# Patient Record
Sex: Female | Born: 2000 | Race: White | Hispanic: No | Marital: Single | Smoking: Never smoker
Health system: Southern US, Community
[De-identification: ages and names within clinical notes are randomized; demographics above are authoritative.]

## PROBLEM LIST (undated history)

## (undated) DIAGNOSIS — K219 Gastro-esophageal reflux disease without esophagitis: Secondary | ICD-10-CM

## (undated) HISTORY — PX: TONSILLECTOMY: SUR1361

---
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
## 2018-12-28 ENCOUNTER — Telehealth: Payer: Medicaid Other | Admitting: Physician Assistant

## 2018-12-28 DIAGNOSIS — R112 Nausea with vomiting, unspecified: Secondary | ICD-10-CM

## 2018-12-28 DIAGNOSIS — R197 Diarrhea, unspecified: Secondary | ICD-10-CM | POA: Diagnosis not present

## 2018-12-28 DIAGNOSIS — E876 Hypokalemia: Secondary | ICD-10-CM | POA: Diagnosis not present

## 2018-12-28 DIAGNOSIS — Z20828 Contact with and (suspected) exposure to other viral communicable diseases: Secondary | ICD-10-CM | POA: Diagnosis not present

## 2018-12-28 MED ORDER — ONDANSETRON HCL 4 MG PO TABS: 4.0000 mg | ORAL_TABLET | Freq: Three times a day (TID) | ORAL | 0 refills | Status: DC | PRN

## 2018-12-28 NOTE — Progress Notes (Signed)
We are sorry that you are not feeling well. Here is how we plan to help!  Based on what you have shared with me it looks like you potentially have a Virus that is irritating your GI tract and causing nausea/vomiting. Significant stress can also cause/contribute to this as the GI tract has its own nervous system and is sensitive to changes in adrenaline caused by stress.  Vomiting is the forceful emptying of a portion of the stomach's content through the mouth.  Although nausea and vomiting can make you feel miserable, it's important to remember that these are not diseases, but rather symptoms of an underlying illness.  When we treat short term symptoms, we always caution that any symptoms that persist should be fully evaluated in a medical office.  I have prescribed a medication that will help alleviate your symptoms and allow you to stay hydrated:  Zofran 4 mg 1 tablet every 8 hours as needed for nausea and vomiting  If this does not calm things down or you note any worsening symptoms, inability to keep hydrated, you need to go to the ER. I do recommend that you be seen by your primary care provider this week for further assessment as well.   HOME CARE:  Drink clear liquids.  This is very important! Dehydration (the lack of fluid) can lead to a serious complication.  Start off with 1 tablespoon every 5 minutes for 8 hours.  You may begin eating bland foods after 8 hours without vomiting.  Start with saltine crackers, white bread, rice, mashed potatoes, applesauce.  After 48 hours on a bland diet, you may resume a normal diet.  Try to go to sleep.  Sleep often empties the stomach and relieves the need to vomit.  GET HELP RIGHT AWAY IF:   Your symptoms do not improve or worsen within 2 days after treatment.  You have a fever for over 3 days.  You cannot keep down fluids after trying the medication.  MAKE SURE YOU:   Understand these instructions.  Will watch your condition.  Will  get help right away if you are not doing well or get worse.   Thank you for choosing an e-visit. Your e-visit answers were reviewed by a board certified advanced clinical practitioner to complete your personal care plan. Depending upon the condition, your plan could have included both over the counter or prescription medications. Please review your pharmacy choice. Be sure that the pharmacy you have chosen is open so that you can pick up your prescription now.  If there is a problem you may message your provider in [place] to have the prescription routed to another pharmacy. Your safety is important to [place]. If you have drug allergies check your prescription carefully.  For the next 24 hours, you can use MyChart to ask questions about today's visit, request a non-urgent call back, or ask for a work or school excuse from your e-visit provider. You will get an e-mail in the next two days asking about your experience. I hope that your e-visit has been valuable and will speed your recovery.

## 2018-12-28 NOTE — Progress Notes (Signed)
I have spent 5 minutes in review of e-visit questionnaire, review and updating patient chart, medical decision making and response to patient.   Jeronimo Hellberg Cody Stockton Nunley, PA-C    

## 2018-12-29 ENCOUNTER — Encounter: Payer: Self-pay | Admitting: Emergency Medicine

## 2018-12-29 ENCOUNTER — Other Ambulatory Visit: Payer: Self-pay

## 2018-12-29 ENCOUNTER — Emergency Department: Payer: Medicaid Other

## 2018-12-29 ENCOUNTER — Emergency Department
Admission: EM | Admit: 2018-12-29 | Discharge: 2018-12-29 | Disposition: A | Discharge status: Home / Self Care | Payer: Medicaid Other | Attending: Emergency Medicine | Admitting: Emergency Medicine

## 2018-12-29 DIAGNOSIS — E876 Hypokalemia: Secondary | ICD-10-CM

## 2018-12-29 DIAGNOSIS — Z20822 Contact with and (suspected) exposure to covid-19: Secondary | ICD-10-CM

## 2018-12-29 DIAGNOSIS — R112 Nausea with vomiting, unspecified: Secondary | ICD-10-CM

## 2018-12-29 HISTORY — DX: Gastro-esophageal reflux disease without esophagitis: K21.9

## 2018-12-29 LAB — COMPREHENSIVE METABOLIC PANEL
ALT: 16 U/L (ref 0–44)
AST: 17 U/L (ref 15–41)
Albumin: 5.3 g/dL — ABNORMAL HIGH (ref 3.5–5.0)
Alkaline Phosphatase: 67 U/L (ref 38–126)
Anion gap: 12 (ref 5–15)
BUN: 21 mg/dL — ABNORMAL HIGH (ref 6–20)
CO2: 24 mmol/L (ref 22–32)
Calcium: 9.8 mg/dL (ref 8.9–10.3)
Chloride: 101 mmol/L (ref 98–111)
Creatinine, Ser: 0.78 mg/dL (ref 0.44–1.00)
GFR calc Af Amer: >60 mL/min (ref >60)
GFR calc non Af Amer: >60 mL/min (ref >60)
Glucose, Bld: 161 mg/dL — ABNORMAL HIGH (ref 70–99)
Potassium: 2.9 mmol/L — ABNORMAL LOW (ref 3.5–5.1)
Sodium: 137 mmol/L (ref 135–145)
Total Bilirubin: 1.3 mg/dL — ABNORMAL HIGH (ref 0.3–1.2)
Total Protein: 8.3 g/dL — ABNORMAL HIGH (ref 6.5–8.1)

## 2018-12-29 LAB — CBC
HCT: 47.6 % — ABNORMAL HIGH (ref 36.0–46.0)
Hemoglobin: 16.9 g/dL — ABNORMAL HIGH (ref 12.0–15.0)
MCH: 31.6 pg (ref 26.0–34.0)
MCHC: 35.5 g/dL (ref 30.0–36.0)
MCV: 89 fL (ref 80.0–100.0)
Platelets: 297 10*3/uL (ref 150–400)
RBC: 5.35 MIL/uL — ABNORMAL HIGH (ref 3.87–5.11)
RDW: 12 % (ref 11.5–15.5)
WBC: 22.7 10*3/uL — ABNORMAL HIGH (ref 4.0–10.5)
nRBC: 0 % (ref 0.0–0.2)

## 2018-12-29 LAB — URINALYSIS, COMPLETE (UACMP) WITH MICROSCOPIC
Bilirubin Urine: NEGATIVE
Glucose, UA: NEGATIVE mg/dL
Ketones, ur: 80 mg/dL — AB
Leukocytes,Ua: NEGATIVE
Nitrite: NEGATIVE
Protein, ur: 30 mg/dL — AB
Specific Gravity, Urine: 1.033 — ABNORMAL HIGH (ref 1.005–1.030)
pH: 5 (ref 5.0–8.0)

## 2018-12-29 LAB — LIPASE, BLOOD: Lipase: 24 U/L (ref 11–51)

## 2018-12-29 LAB — POCT PREGNANCY, URINE: Preg Test, Ur: NEGATIVE

## 2018-12-29 MED ORDER — ONDANSETRON HCL 4 MG/2ML IJ SOLN
4.0000 mg | Freq: Once | INTRAMUSCULAR | Status: AC
  Administered 2018-12-29: 4 mg via INTRAVENOUS
  Filled 2018-12-29: qty 2

## 2018-12-29 MED ORDER — KETOROLAC TROMETHAMINE 30 MG/ML IJ SOLN
15.0000 mg | Freq: Once | INTRAMUSCULAR | Status: AC
  Administered 2018-12-29: 15 mg via INTRAVENOUS
  Filled 2018-12-29: qty 1

## 2018-12-29 MED ORDER — FAMOTIDINE IN NACL 20-0.9 MG/50ML-% IV SOLN
20.0000 mg | Freq: Once | INTRAVENOUS | Status: AC
  Administered 2018-12-29: 20 mg via INTRAVENOUS
  Filled 2018-12-29: qty 50

## 2018-12-29 MED ORDER — POTASSIUM CHLORIDE CRYS ER 20 MEQ PO TBCR
40.0000 meq | EXTENDED_RELEASE_TABLET | Freq: Once | ORAL | Status: AC
  Administered 2018-12-29: 40 meq via ORAL
  Filled 2018-12-29: qty 2

## 2018-12-29 MED ORDER — ONDANSETRON 4 MG PO TBDP: 4.0000 mg | ORAL_TABLET | Freq: Three times a day (TID) | ORAL | 0 refills | Status: DC | PRN

## 2018-12-29 MED ORDER — SODIUM CHLORIDE 0.9 % IV BOLUS
1000.0000 mL | Freq: Once | INTRAVENOUS | Status: AC
Start: 2018-12-29 — End: 2018-12-29
  Administered 2018-12-29: 1000 mL via INTRAVENOUS

## 2018-12-29 NOTE — ED Notes (Signed)
Patient assisted to bathroom 

## 2018-12-29 NOTE — Discharge Instructions (Signed)

## 2018-12-29 NOTE — ED Provider Notes (Signed)
[facility] Emergency Department Provider Note  ____________________________________________  Time seen: Approximately 1:59 AM  I have reviewed the triage vital signs and the nursing notes.   HISTORY  Chief Complaint Abdominal Pain, Diarrhea, Cough, Generalized Body Aches, and Headache   HPI Robin Maxwell is a 18 y.o. female no significant past medical history who presents for evaluation of COVID-like symptoms.  Patient has had symptoms for 3 days.  She is complaining of nausea, several daily episodes of nonbloody nonbilious emesis, has been unable to keep anything down.   Also reports several daily episodes of diarrhea.  Has had subjective fever and body aches.  She is complaining of cough.  No chest pain or shortness of breath, no dysuria or hematuria.  She is complaining of severe diffuse cramping abdominal pain.  No known exposures to COVID.  Patient does not work.  Past Medical History:  Diagnosis Date  . GERD (gastroesophageal reflux disease)     Past Surgical History:  Procedure Laterality Date  . TONSILLECTOMY      Prior to Admission medications   Medication Sig Start Date End Date Taking? Authorizing Provider  famotidine (PEPCID) 20 MG tablet Take 1 tablet (20 mg total) by mouth 2 (two) times daily. 06/09/16  Yes Emily FilbertWilliams, Jonathan E, MD  ondansetron (ZOFRAN ODT) 4 MG disintegrating tablet Take 1 tablet (4 mg total) by mouth every 8 (eight) hours as needed. 12/29/18   Nita SickleVeronese, [place], MD    Allergies Patient has no known allergies.  History reviewed. No pertinent family history.  Social History Social History   Tobacco Use  . Smoking status: Never Smoker  . Smokeless tobacco: Never Used  Substance Use Topics  . Alcohol use: Yes  . Drug use: Never    Review of Systems  Constitutional: + fever, body aches Eyes: Negative for visual changes. ENT: Negative for sore throat. Neck: No neck pain  Cardiovascular: Negative for chest  pain. Respiratory: Negative for shortness of breath. + cough Gastrointestinal: + abdominal pain, vomiting and diarrhea. Genitourinary: Negative for dysuria. Musculoskeletal: Negative for back pain. Skin: Negative for rash. Neurological: Negative for headaches, weakness or numbness. Psych: No SI or HI  ____________________________________________   PHYSICAL EXAM:  VITAL SIGNS: ED Triage Vitals  Enc Vitals Group     BP 12/29/18 0013 134/83     Pulse Rate 12/29/18 0013 71     Resp 12/29/18 0013 16     Temp 12/29/18 0013 99.7 F (37.6 C)     Temp Source 12/29/18 0013 Oral     SpO2 12/29/18 0013 97 %     Weight 12/29/18 0010 159 lb 13.3 oz (72.5 kg)     Height 12/29/18 0010 5\' 3"  (1.6 m)     Head Circumference --      Peak Flow --      Pain Score 12/29/18 0009 9     Pain Loc --      Pain Edu? --      Excl. in GC? --     Constitutional: Alert and oriented. Well appearing and in no apparent distress. HEENT:      Head: Normocephalic and atraumatic.         Eyes: Conjunctivae are normal. Sclera is non-icteric.       Mouth/Throat: Mucous membranes are moist.       Neck: Supple with no signs of meningismus. Cardiovascular: Regular rate and rhythm. No murmurs, gallops, or rubs. 2+ symmetrical distal pulses are present in all  extremities. No JVD. Respiratory: Normal respiratory effort. Lungs are clear to auscultation bilaterally. No wheezes, crackles, or rhonchi.  Gastrointestinal: Soft, mild diffuse tenderness to palpation worse on the left quadrants, and non distended with positive bowel sounds. No rebound or guarding. Genitourinary: No CVA tenderness. Musculoskeletal: Nontender with normal range of motion in all extremities. No edema, cyanosis, or erythema of extremities. Neurologic: Normal speech and language. Face is symmetric. Moving all extremities. No gross focal neurologic deficits are appreciated. Skin: Skin is warm, dry and intact. No rash noted. Psychiatric: Mood and  affect are normal. Speech and behavior are normal.  ____________________________________________   LABS (all labs ordered are listed, but only abnormal results are displayed)  Labs Reviewed  COMPREHENSIVE METABOLIC PANEL - Abnormal; Notable for the following components:      Result Value   Potassium 2.9 (*)    Glucose, Bld 161 (*)    BUN 21 (*)    Total Protein 8.3 (*)    Albumin 5.3 (*)    Total Bilirubin 1.3 (*)    All other components within normal limits  CBC - Abnormal; Notable for the following components:   WBC 22.7 (*)    RBC 5.35 (*)    Hemoglobin 16.9 (*)    HCT 47.6 (*)    All other components within normal limits  URINALYSIS, COMPLETE (UACMP) WITH MICROSCOPIC - Abnormal; Notable for the following components:   Color, Urine YELLOW (*)    APPearance HAZY (*)    Specific Gravity, Urine 1.033 (*)    Hgb urine dipstick SMALL (*)    Ketones, ur 80 (*)    Protein, ur 30 (*)    Bacteria, UA RARE (*)    All other components within normal limits  NOVEL CORONAVIRUS, NAA (HOSPITAL ORDER, SEND-OUT TO REF LAB)  LIPASE, BLOOD  POCT PREGNANCY, URINE  POC URINE PREG, ED   ____________________________________________  EKG  none  ____________________________________________  RADIOLOGY  I have personally reviewed the images performed during this visit and I agree with the Radiologist's read.   Interpretation by Radiologist:  Dg Chest Portable 1 View  Result Date: 12/29/2018 CLINICAL DATA:  18 year old female with vomiting and abdominal pain. EXAM: PORTABLE CHEST 1 VIEW COMPARISON:  None. FINDINGS: The heart size and mediastinal contours are within normal limits. Both lungs are clear. The visualized skeletal structures are unremarkable. IMPRESSION: No active disease. Electronically Signed   By: Elgie CollardArash  Radparvar M.D.   On: 12/29/2018 02:14      ____________________________________________   PROCEDURES  Procedure(s) performed: None Procedures Critical Care  performed:  None ____________________________________________   INITIAL IMPRESSION / ASSESSMENT AND PLAN / ED COURSE  18 y.o. female no significant past medical history who presents for evaluation of COVID-like symptoms x 3 days including fever, body aches, vomiting, diarrhea, cough, abdominal.  Patient is well-appearing and in no distress with normal vital signs, normal work of breathing, normal sats, abdomen is soft with diffuse mild tenderness with no localized tenderness, rebound or guarding.  Differential diagnoses including COVID-19 versus viral gastroenteritis versus pneumonia.  Will check labs, chest x-ray, duo COVID swab.  At this time low suspicion for appendicitis with GI and respiratory symptoms, diffuse tenderness which is worse on the left quadrants.  Will give Toradol, fluids, and Zofran    _________________________ 4:14 AM on 12/29/2018 -----------------------------------------  Labs showing elevated white count of 22.7 consistent with infectious etiology.  LFTs and lipase are within normal limits.  UA negative for UTI.  Chest x-ray negative for  pneumonia.  COVID swabs pending.  Patient feels markedly improved after medications given.  Serial abdominal exams showing no further tenderness on palpation.  Discussed importance to return to the emergency room if abdominal pain returns and migrates to the right lower quadrant.  Also discussed quarantine for COVID-19 for patient and household members.  Discussed getting a pulse oximeter to check for hypoxia.  Discussed my standard return precautions.  Discussed follow-up with PCP.  Patient is tolerating p.o. with no further episodes of vomiting, diarrhea, or abdominal pain.    As part of my medical decision making, I reviewed the following data within the [place] notes reviewed and incorporated, Labs reviewed , Old chart reviewed, Radiograph reviewed , Notes from prior ED visits and [place] Controlled Substance  Database   Patient was evaluated in Emergency Department today for the symptoms described in the history of present illness. Patient was evaluated in the context of the global COVID-19 pandemic, which necessitated consideration that the patient might be at risk for infection with the SARS-CoV-2 virus that causes COVID-19. Institutional protocols and algorithms that pertain to the evaluation of patients at risk for COVID-19 are in a state of rapid change based on information released by regulatory bodies including the CDC and federal and state organizations. These policies and algorithms were followed during the patient's care in the ED.   ____________________________________________   FINAL CLINICAL IMPRESSION(S) / ED DIAGNOSES   Final diagnoses:  Suspected Covid-19 Virus Infection  Hypokalemia  Nausea vomiting and diarrhea      NEW MEDICATIONS STARTED DURING THIS VISIT:  ED Discharge Orders         Ordered    ondansetron (ZOFRAN ODT) 4 MG disintegrating tablet  Every 8 hours PRN     12/29/18 0413           Note:  This document was prepared using Dragon voice recognition software and may include unintentional dictation errors.    Alfred Levins, [place], MD 12/29/18 [phone]

## 2018-12-29 NOTE — ED Notes (Signed)
Patient given ginger ale at this time. 

## 2018-12-29 NOTE — ED Notes (Signed)
Family at bedside. 

## 2018-12-29 NOTE — ED Triage Notes (Addendum)
Pt reports low midline abd pain with N/V/D for 3 days; pt says when she vomits she has burning in her esophagus; denies urinary s/s; pt in no acute distress; pt adds she's had intermittent headache for the last 3 days; also having cough and body aches;

## 2019-01-01 LAB — NOVEL CORONAVIRUS, NAA (HOSP ORDER, SEND-OUT TO REF LAB; TAT 18-24 HRS): SARS-CoV-2, NAA: NOT DETECTED

## 2019-11-04 ENCOUNTER — Telehealth: Payer: Self-pay | Admitting: Family Medicine

## 2019-11-04 NOTE — Telephone Encounter (Signed)
Phone call to pt. Last sex 2 months ago. Nexplanon placed about 3 years ago. Pt to continue to abstain from sex, at minimum use back-up method will all sex. Per pt preference for Monday appts, scheduled physical and nexplanon removal/reinsertion for 11/24/19.

## 2019-11-04 NOTE — Telephone Encounter (Signed)
patient wants nexplanon removed and re inserted

## 2019-11-24 ENCOUNTER — Other Ambulatory Visit: Payer: Self-pay

## 2019-11-24 ENCOUNTER — Ambulatory Visit (LOCAL_COMMUNITY_HEALTH_CENTER): Payer: Medicaid Other | Admitting: Family Medicine

## 2019-11-24 ENCOUNTER — Encounter: Payer: Self-pay | Admitting: Family Medicine

## 2019-11-24 ENCOUNTER — Ambulatory Visit: Payer: Medicaid Other

## 2019-11-24 VITALS — BP 103/63 | Ht 63.0 in | Wt 147.4 lb

## 2019-11-24 DIAGNOSIS — Z01419 Encounter for gynecological examination (general) (routine) without abnormal findings: Secondary | ICD-10-CM | POA: Diagnosis not present

## 2019-11-24 DIAGNOSIS — Z975 Presence of (intrauterine) contraceptive device: Secondary | ICD-10-CM

## 2019-11-24 DIAGNOSIS — Z30017 Encounter for initial prescription of implantable subdermal contraceptive: Secondary | ICD-10-CM | POA: Diagnosis not present

## 2019-11-24 DIAGNOSIS — Z3009 Encounter for other general counseling and advice on contraception: Secondary | ICD-10-CM | POA: Diagnosis not present

## 2019-11-24 DIAGNOSIS — Z113 Encounter for screening for infections with a predominantly sexual mode of transmission: Secondary | ICD-10-CM

## 2019-11-24 DIAGNOSIS — Z3046 Encounter for surveillance of implantable subdermal contraceptive: Secondary | ICD-10-CM

## 2019-11-24 LAB — WET PREP FOR TRICH, YEAST, CLUE
Trichomonas Exam: NEGATIVE
Yeast Exam: NEGATIVE

## 2019-11-24 MED ORDER — ETONOGESTREL 68 MG ~~LOC~~ IMPL
68.0000 mg | DRUG_IMPLANT | Freq: Once | SUBCUTANEOUS | Status: AC
  Administered 2019-11-24: 68 mg via SUBCUTANEOUS

## 2019-11-24 NOTE — Progress Notes (Signed)
Pt did not return from lab and did not return to clinic for posting.  Condoms declined during initial encounter with pt.  Wet mount reviewed, no tx per standing order; pt left and did not return to clinic to discuss lab results.

## 2019-11-24 NOTE — Progress Notes (Signed)
Family Planning Visit- Initial Visit  Subjective:  Robin Maxwell is a 19 y.o.  G0P0000  being seen today for an initial well woman visit and to discuss family planning options. Patient reports they do not want a pregnancy in the next year.   Chief Complaint  Patient presents with  . Contraception  . Annual Exam    Pt has Clavicle fracture, shaft and Nexplanon in place on their problem list.   HPI  Patient reports she is here for PE, STI check and Nexplanon insertion/removal. Last Nexplanon placed 11/23/2016  Pt denies all of the following contraindications to Nexplanon: -Known or suspected pregnancy -Current or past history of thrombosis or thromboembolic disorders -Hepatic tumor or active liver disease -Undiagnosed abnormal genital bleeding -Known or suspected breast cancer, history of breast cancer, or other progestin-sensitive cancer -Allergic reaction to any component of the method    Patient's last menstrual period was 11/16/2019 (exact date). Last sex: 2 months BCM: nexplanon Pt desires EC? n/a  Last pap per pt/review of record: never d/t age Last HIV test per pt/review of record: "a long time" Last tetanus vaccine: 01/2010  Last breast exam: never Personal/family hx breast cancer? no  Patient reports 2 partner(s) in last year. Do they desire STI screening (if no, why not)? yes  Does the patient desire a pregnancy in the next year? no   19 y.o., Body mass index is 26.11 kg/m. - Is patient eligible for HA1C diabetes screening based on BMI and age >43?  no  Has patient been screened once for HCV in the past?  no  No results found for: HCVAB  Does the patient have current of drug use, have a partner with drug use, and/or has been incarcerated since last result? no If yes-- Screen for HCV through [facility]   Does the patient meet criteria for HBV testing? no Criteria:  -Household, sexual or needle sharing contact with HBV -History of drug use -HIV  positive -Those with known Hep C  PHQ-2 score: 0  See flowsheet for other program required questions.   Health Maintenance Due  Topic Date Due  . Hepatitis C Screening  Never done  . HIV Screening  Never done  . TETANUS/TDAP  Never done  . PAP-Cervical Cytology Screening  Never done  . PAP SMEAR-Modifier  Never done    ROS 10 point review of systems is otherwise negative except as mentioned in HPI and listed below: none  The following portions of the patient's history were reviewed and updated as appropriate: allergies, current medications, past family history, past medical history, past social history, past surgical history and problem list. Problem list updated.   See flowsheet for other program required questions.  Objective:   Vitals:   11/24/19 1041  BP: 103/63  Weight: 147 lb 6.4 oz (66.9 kg)  Height: 5\' 3"  (1.6 m)    Physical Exam Vitals and nursing note reviewed.  Constitutional:      Appearance: Normal appearance.  HENT:     Head: Normocephalic and atraumatic.     Mouth/Throat:     Mouth: Mucous membranes are moist.     Pharynx: Oropharynx is clear. No oropharyngeal exudate or posterior oropharyngeal erythema.  Eyes:     Conjunctiva/sclera: Conjunctivae normal.  Neck:     Thyroid: No thyroid mass, thyromegaly or thyroid tenderness.  Cardiovascular:     Rate and Rhythm: Normal rate and regular rhythm.     Pulses: Normal pulses.  Heart sounds: Normal heart sounds.  Pulmonary:     Effort: Pulmonary effort is normal.     Breath sounds: Normal breath sounds.  Chest:     Breasts:        Right: Normal. No swelling, mass, nipple discharge, skin change or tenderness.        Left: Normal. No swelling, mass, nipple discharge, skin change or tenderness.  Abdominal:     General: Abdomen is flat.     Palpations: There is no mass.     Tenderness: There is no abdominal tenderness. There is no rebound.  Genitourinary:     General: Normal vulva.     Exam  position: Lithotomy position.     Pubic Area: No rash or pubic lice.      Labia:        Right: No rash or lesion.        Left: No rash or lesion.      Vagina: Normal. No vaginal discharge, erythema, bleeding or lesions.     Cervix: No cervical motion tenderness, discharge, friability, lesion or erythema.     Uterus: Normal.      Adnexa: Right adnexa normal and left adnexa normal.     Rectum: Normal.  Lymphadenopathy:     Head:     Right side of head: No preauricular or posterior auricular adenopathy.     Left side of head: No preauricular or posterior auricular adenopathy.     Cervical: No cervical adenopathy.     Upper Body:     Right upper body: No supraclavicular or axillary adenopathy.     Left upper body: No supraclavicular or axillary adenopathy.     Lower Body: No right inguinal adenopathy. No left inguinal adenopathy.  Skin:    General: Skin is warm and dry.     Findings: No rash.  Neurological:     Mental Status: She is alert and oriented to person, place, and time.   Nexplanon Removal and Insertion  Patient identified, informed consent performed, consent signed.   Patient does understand that irregular bleeding is a very common side effect of this medication. She was advised to have backup contraception for one week after replacement of the implant. Patient deemed to meet WHO criteria for being reasonably certain she is not pregnant.  Appropriate time out taken. Nexplanon site identified. Area prepped in usual sterile fashon. 3 ml of 1% lidocaine with epinephrine was used to anesthetize the area at the distal end of the implant. A small stab incision was made right beside the implant on the distal portion. The Nexplanon rod was grasped using hemostats and removed without difficulty. There was minimal blood loss. There were no complications.   Confirmed correct location of insertion site. The insertion site was identified 8-10 cm (3-4 inches) from the medial epicondyle of the  humerus and 3-5 cm (1.25-2 inches) posterior to (below) the sulcus (groove) between the biceps and triceps muscles of the patient's left arm. New Nexplanon removed from packaging, Device confirmed in needle, then inserted full length of needle and withdrawn per handbook instructions. Nexplanon was able to palpated in the patient's left arm; patient palpated the insert herself.  There was minimal blood loss. Patient insertion site covered with guaze and a pressure bandage to reduce any bruising. The patient tolerated the procedure well and was given post procedure instructions.      Assessment and Plan:  Robin Maxwell is a 19 y.o. female presenting to the [facility] Department for an initial well woman exam/family planning visit.  Contraception counseling: Reviewed all forms of birth control options in the tiered based approach. available including abstinence; over the counter/barrier methods; hormonal contraceptive medication including pill, patch, ring, injection,contraceptive implant, ECP; hormonal and nonhormonal IUDs; permanent sterilization options including vasectomy and the various tubal sterilization modalities. Risks, benefits, and typical effectiveness rates were reviewed.  Questions were answered.  Written information was also given to the patient to review.  Patient desires Nexplanon again, this was prescribed for patient. She will follow up in  1 year for surveillance.  She was told to call with any further questions, or with any concerns about this method of contraception.  Emphasized use of condoms 100% of the time for STI prevention.  Emergency Contraception: n/a - continuous use of nexplanon   1. Well woman exam -BCM: nexplanon replaced today. BCM counseling as above. -Pap: done today -CBE: done today. "Active FYIs" info up to date. Recommended screening mammograms beginning at age 21 - etonogestrel (NEXPLANON) implant 68 mg - Pap IG (Image Guided)  2. Screening  examination for venereal disease -Pt without symptoms. Screenings today as below. Treat wet prep per standing order. -Patient does not meet criteria for HepB, HepC Screening.  -Counseled on warning s/sx and when to seek care. Recommended condom use with all sex and discussed importance of condom use for STI prevention. - WET PREP FOR TRICH, YEAST, CLUE - Chlamydia/Gonorrhea [place] [facility] - HIV [place] [facility] - Syphilis Serology, [place] [facility]  3. Encounter for removal and reinsertion of Nexplanon -Nexplanon replaced today. -Counseling regarding risks and benefits of Nexplanon given. Advised to keep track of bleeding pattern and if bothersome to RTC for  management strategies. -Use backup protection x7 days - etonogestrel (NEXPLANON) implant 68 mg       Return in about 1 year (around 11/23/2020) for yearly wellness exam.  No future appointments.  Ann Held, PA-C

## 2019-11-24 NOTE — Progress Notes (Signed)
Pt here physical, STD checks, and nexplanon removal & reinsertion.

## 2019-11-26 LAB — PAP IG (IMAGE GUIDED): PAP Smear Comment: 0

## 2019-12-30 ENCOUNTER — Telehealth: Payer: Self-pay | Admitting: Family Medicine

## 2019-12-30 NOTE — Telephone Encounter (Signed)
WANTS STD TEST RESULTS AND PAP RESULTS

## 2019-12-30 NOTE — Telephone Encounter (Signed)
TC to patient. Verified ID via password/SS#. Informed of neg test results from June 2021 visit. Helped patient reset password for Mychart. Richmond Campbell, RN

## 2020-05-21 ENCOUNTER — Ambulatory Visit
Admission: EM | Admit: 2020-05-21 | Discharge: 2020-05-21 | Disposition: A | Discharge status: Home / Self Care | Payer: Medicaid Other | Attending: Physician Assistant | Admitting: Physician Assistant

## 2020-05-21 ENCOUNTER — Encounter: Payer: Self-pay | Admitting: Emergency Medicine

## 2020-05-21 ENCOUNTER — Other Ambulatory Visit: Payer: Self-pay

## 2020-05-21 DIAGNOSIS — R300 Dysuria: Secondary | ICD-10-CM | POA: Insufficient documentation

## 2020-05-21 LAB — URINALYSIS, COMPLETE (UACMP) WITH MICROSCOPIC
Glucose, UA: NEGATIVE mg/dL
Leukocytes,Ua: NEGATIVE
Nitrite: NEGATIVE
Protein, ur: NEGATIVE mg/dL
Specific Gravity, Urine: 1.025 (ref 1.005–1.030)
pH: 6 (ref 5.0–8.0)

## 2020-05-21 LAB — WET PREP, GENITAL
Clue Cells Wet Prep HPF POC: NONE SEEN
Sperm: NONE SEEN
Trich, Wet Prep: NONE SEEN
WBC, Wet Prep HPF POC: NONE SEEN
Yeast Wet Prep HPF POC: NONE SEEN

## 2020-05-21 MED ORDER — PHENAZOPYRIDINE HCL 200 MG PO TABS: 200.0000 mg | ORAL_TABLET | Freq: Three times a day (TID) | ORAL | 0 refills | Status: AC

## 2020-05-21 NOTE — ED Triage Notes (Signed)
Patient c/o burning when urinating for the past 3 days.  Patient denies urinary urgency or frequency.

## 2020-05-21 NOTE — Discharge Instructions (Signed)
We will call you if your urine culture is positive which would mean you have a UTI.  If the urine culture is positive we will send antibiotics for UTI.  At this time, you can take the Pyridium as needed for symptoms.  Make sure you are increasing her fluid intake as well.  Try to use pH balanced washes and wipes.

## 2020-05-21 NOTE — ED Provider Notes (Signed)
[facility]    CSN: [id number] Arrival date & time: 05/21/20  1329      History   Chief Complaint Chief Complaint  Patient presents with  . Dysuria    HPI Robin Maxwell is a 19 y.o. female presenting for 3-day history of painful urination.  She denies any other associated symptoms.  Denies any fever, fatigue, abdominal pain, back pain, hematuria, urinary frequency urgency.  Denies any vaginal discharge, itching or lesions.  Patient states she is not sexually active and denies any concerns for STIs.  Has not taken any over-the-counter medication for symptoms.  Patient has never had a UTI before.  No other concerns today.  HPI  Past Medical History:  Diagnosis Date  . GERD (gastroesophageal reflux disease)     Patient Active Problem List   Diagnosis Date Noted  . Nexplanon in place 11/24/2019  . Clavicle fracture, shaft 06/17/2013    Past Surgical History:  Procedure Laterality Date  . TONSILLECTOMY      OB History    Gravida  0   Para  0   Term  0   Preterm  0   AB  0   Living  0     SAB  0   IAB  0   Ectopic  0   Multiple  0   Live Births  0            Home Medications    Prior to Admission medications   Medication Sig Start Date End Date Taking? Authorizing Provider  etonogestrel (NEXPLANON) 68 MG IMPL implant 1 each by Subdermal route once.    [provider]  phenazopyridine (PYRIDIUM) 200 MG tablet Take 1 tablet (200 mg total) by mouth 3 (three) times daily for 2 days. 05/21/20 05/23/20  Shirlee Latch, PA-C    Family History Family History  Problem Relation Age of Onset  . Breast cancer Neg Hx     Social History Social History   Tobacco Use  . Smoking status: Never Smoker  . Smokeless tobacco: Never Used  Vaping Use  . Vaping Use: Some days  . Devices: 1 cartridge = 2 wks  Substance Use Topics  . Alcohol use: Not Currently    Comment: occasionally  . Drug use: Not Currently    Types:  Marijuana     Allergies   Patient has no known allergies.   Review of Systems Review of Systems  Constitutional: Negative for chills and fever.  Gastrointestinal: Negative for abdominal pain, diarrhea, nausea and vomiting.  Genitourinary: Positive for dysuria. Negative for decreased urine volume, flank pain, frequency, hematuria, pelvic pain, urgency, vaginal bleeding, vaginal discharge and vaginal pain.  Musculoskeletal: Negative for back pain.  Skin: Negative for rash.     Physical Exam Triage Vital Signs ED Triage Vitals  Enc Vitals Group     BP 05/21/20 1345 140/80     Pulse Rate 05/21/20 1345 93     Resp 05/21/20 1345 14     Temp 05/21/20 1345 98.7 F (37.1 C)     Temp Source 05/21/20 1345 Oral     SpO2 05/21/20 1345 100 %     Weight 05/21/20 1342 137 lb 3.2 oz (62.2 kg)     Height 05/21/20 1342 5\' 3"  (1.6 m)     Head Circumference --      Peak Flow --      Pain Score 05/21/20 1342 6     Pain Loc --  Pain Edu? --      Excl. in GC? --    No data found.  Updated Vital Signs BP 140/80 (BP Location: Left Arm)   Pulse 93   Temp 98.7 F (37.1 C) (Oral)   Resp 14   Ht 5\' 3"  (1.6 m)   Wt 137 lb 3.2 oz (62.2 kg)   LMP 05/14/2020 (Approximate)   SpO2 100%   BMI 24.30 kg/m       Physical Exam Vitals and nursing note reviewed.  Constitutional:      General: She is not in acute distress.    Appearance: Normal appearance. She is not ill-appearing or toxic-appearing.  HENT:     Head: Normocephalic and atraumatic.  Eyes:     General: No scleral icterus.       Right eye: No discharge.        Left eye: No discharge.     Conjunctiva/sclera: Conjunctivae normal.  Cardiovascular:     Rate and Rhythm: Normal rate and regular rhythm.     Heart sounds: Normal heart sounds.  Pulmonary:     Effort: Pulmonary effort is normal. No respiratory distress.     Breath sounds: Normal breath sounds.  Abdominal:     Palpations: Abdomen is soft.     Tenderness: There is  no abdominal tenderness. There is no right CVA tenderness or left CVA tenderness.  Musculoskeletal:     Cervical back: Neck supple.  Skin:    General: Skin is dry.  Neurological:     General: No focal deficit present.     Mental Status: She is alert. Mental status is at baseline.     Motor: No weakness.     Gait: Gait normal.  Psychiatric:        Mood and Affect: Mood normal.        Behavior: Behavior normal.        Thought Content: Thought content normal.      UC Treatments / Results  Labs (all labs ordered are listed, but only abnormal results are displayed) Labs Reviewed  URINALYSIS, COMPLETE (UACMP) WITH MICROSCOPIC - Abnormal; Notable for the following components:      Result Value   APPearance HAZY (*)    Hgb urine dipstick TRACE (*)    Bilirubin Urine SMALL (*)    Ketones, ur TRACE (*)    Bacteria, UA MANY (*)    All other components within normal limits  WET PREP, GENITAL  URINE CULTURE    EKG   Radiology No results found.  Procedures Procedures (including critical care time)  Medications Ordered in UC Medications - No data to display  Initial Impression / Assessment and Plan / UC Course  I have reviewed the triage vital signs and the nursing notes.  Pertinent labs & imaging results that were available during my care of the patient were reviewed by me and considered in my medical decision making (see chart for details).   19 year old female presenting for onset of painful urination 3 days ago.  No other associated symptoms.  Last menstrual period 1 week ago.  Urinalysis positive for trace blood and negative for nitrites and leukocytes.  Sample is not a clean-catch based on the UA.  Wet prep normal.  Urine will be sent for culture.  Treating patient at this time with Pyridium for the symptoms and advised to increase fluid intake.  Advised patient will call if she needs to start antibiotics based on urine culture.  ED precautions reviewed.  Final Clinical  Impressions(s) / UC Diagnoses   Final diagnoses:  Dysuria     Discharge Instructions     We will call you if your urine culture is positive which would mean you have a UTI.  If the urine culture is positive we will send antibiotics for UTI.  At this time, you can take the Pyridium as needed for symptoms.  Make sure you are increasing her fluid intake as well.  Try to use pH balanced washes and wipes.    ED Prescriptions    Medication Sig Dispense Auth. Provider   phenazopyridine (PYRIDIUM) 200 MG tablet Take 1 tablet (200 mg total) by mouth 3 (three) times daily for 2 days. 6 tablet Gareth Morgan     PDMP not reviewed this encounter.   Shirlee Latch, PA-C 05/21/20 1423

## 2020-05-23 LAB — URINE CULTURE: Culture: <10000 — AB

## 2021-01-06 ENCOUNTER — Ambulatory Visit
Admission: EM | Admit: 2021-01-06 | Discharge: 2021-01-06 | Disposition: A | Discharge status: Home / Self Care | Payer: Medicaid Other | Attending: Internal Medicine | Admitting: Internal Medicine

## 2021-01-06 ENCOUNTER — Other Ambulatory Visit: Payer: Self-pay

## 2021-01-06 DIAGNOSIS — K2970 Gastritis, unspecified, without bleeding: Secondary | ICD-10-CM

## 2021-01-06 MED ORDER — OMEPRAZOLE 20 MG PO CPDR: 20.0000 mg | DELAYED_RELEASE_CAPSULE | Freq: Every day | ORAL | 0 refills | Status: DC

## 2021-01-06 NOTE — ED Triage Notes (Signed)
Patient states that she has been having lower abdominal pain since 2 weeks. States that she has been having symptoms off and on. Reports that "my stomach gets in a knot and I vomit and I dont feel bad anymore."

## 2021-01-06 NOTE — Discharge Instructions (Addendum)
Follow up with a primary care provider in 7-14 days or if you get worse come back

## 2021-08-09 ENCOUNTER — Observation Stay
Admission: EM | Admit: 2021-08-09 | Discharge: 2021-08-11 | Disposition: A | Discharge status: Home / Self Care | Payer: Medicaid Other | Source: Ambulatory Visit | Attending: Obstetrics and Gynecology | Admitting: Obstetrics and Gynecology

## 2021-08-09 ENCOUNTER — Other Ambulatory Visit: Payer: Self-pay

## 2021-08-09 ENCOUNTER — Ambulatory Visit
Admission: EM | Admit: 2021-08-09 | Discharge: 2021-08-09 | Payer: Medicaid Other | Attending: Family | Admitting: Family

## 2021-08-09 DIAGNOSIS — K529 Noninfective gastroenteritis and colitis, unspecified: Principal | ICD-10-CM | POA: Insufficient documentation

## 2021-08-09 DIAGNOSIS — Z20822 Contact with and (suspected) exposure to covid-19: Secondary | ICD-10-CM | POA: Insufficient documentation

## 2021-08-09 DIAGNOSIS — R1013 Epigastric pain: Secondary | ICD-10-CM | POA: Diagnosis not present

## 2021-08-09 DIAGNOSIS — F17290 Nicotine dependence, other tobacco product, uncomplicated: Secondary | ICD-10-CM | POA: Diagnosis present

## 2021-08-09 DIAGNOSIS — R1114 Bilious vomiting: Secondary | ICD-10-CM | POA: Diagnosis not present

## 2021-08-09 DIAGNOSIS — R197 Diarrhea, unspecified: Secondary | ICD-10-CM | POA: Diagnosis not present

## 2021-08-09 DIAGNOSIS — T797XXA Traumatic subcutaneous emphysema, initial encounter: Secondary | ICD-10-CM | POA: Diagnosis present

## 2021-08-09 DIAGNOSIS — T7029XA Other effects of high altitude, initial encounter: Secondary | ICD-10-CM

## 2021-08-09 DIAGNOSIS — R109 Unspecified abdominal pain: Secondary | ICD-10-CM | POA: Diagnosis present

## 2021-08-09 DIAGNOSIS — X58XXXA Exposure to other specified factors, initial encounter: Secondary | ICD-10-CM | POA: Diagnosis present

## 2021-08-09 DIAGNOSIS — A0811 Acute gastroenteropathy due to Norwalk agent: Secondary | ICD-10-CM

## 2021-08-09 DIAGNOSIS — J982 Interstitial emphysema: Secondary | ICD-10-CM | POA: Diagnosis present

## 2021-08-09 LAB — CBC
HCT: 46.6 % — ABNORMAL HIGH (ref 36.0–46.0)
Hemoglobin: 16.4 g/dL — ABNORMAL HIGH (ref 12.0–15.0)
MCH: 31.7 pg (ref 26.0–34.0)
MCHC: 35.2 g/dL (ref 30.0–36.0)
MCV: 90.1 fL (ref 80.0–100.0)
Platelets: 235 10*3/uL (ref 150–400)
RBC: 5.17 MIL/uL — ABNORMAL HIGH (ref 3.87–5.11)
RDW: 11.7 % (ref 11.5–15.5)
WBC: 21.6 10*3/uL — ABNORMAL HIGH (ref 4.0–10.5)
nRBC: 0 % (ref 0.0–0.2)

## 2021-08-09 LAB — COMPREHENSIVE METABOLIC PANEL
ALT: 18 U/L (ref 0–44)
AST: 19 U/L (ref 15–41)
Albumin: 5.1 g/dL — ABNORMAL HIGH (ref 3.5–5.0)
Alkaline Phosphatase: 55 U/L (ref 38–126)
Anion gap: 15 (ref 5–15)
BUN: 24 mg/dL — ABNORMAL HIGH (ref 6–20)
CO2: 23 mmol/L (ref 22–32)
Calcium: 10.1 mg/dL (ref 8.9–10.3)
Chloride: 103 mmol/L (ref 98–111)
Creatinine, Ser: 0.65 mg/dL (ref 0.44–1.00)
GFR, Estimated: >60 mL/min (ref >60)
Glucose, Bld: 185 mg/dL — ABNORMAL HIGH (ref 70–99)
Potassium: 3.6 mmol/L (ref 3.5–5.1)
Sodium: 141 mmol/L (ref 135–145)
Total Bilirubin: 1.9 mg/dL — ABNORMAL HIGH (ref 0.3–1.2)
Total Protein: 8 g/dL (ref 6.5–8.1)

## 2021-08-09 LAB — URINALYSIS, ROUTINE W REFLEX MICROSCOPIC
Bilirubin Urine: NEGATIVE
Glucose, UA: NEGATIVE mg/dL
Ketones, ur: 80 mg/dL — AB
Leukocytes,Ua: NEGATIVE
Nitrite: NEGATIVE
Protein, ur: 30 mg/dL — AB
Specific Gravity, Urine: 1.033 — ABNORMAL HIGH (ref 1.005–1.030)
pH: 5 (ref 5.0–8.0)

## 2021-08-09 LAB — LIPASE, BLOOD: Lipase: 25 U/L (ref 11–51)

## 2021-08-09 LAB — POC URINE PREG, ED: Preg Test, Ur: NEGATIVE

## 2021-08-09 MED ORDER — ONDANSETRON HCL 4 MG/2ML IJ SOLN
4.0000 mg | Freq: Once | INTRAMUSCULAR | Status: AC
  Administered 2021-08-10: 4 mg via INTRAVENOUS
  Filled 2021-08-09: qty 2

## 2021-08-09 MED ORDER — KETOROLAC TROMETHAMINE 30 MG/ML IJ SOLN
15.0000 mg | Freq: Once | INTRAMUSCULAR | Status: AC
  Administered 2021-08-10: 15 mg via INTRAVENOUS
  Filled 2021-08-09: qty 1

## 2021-08-09 MED ORDER — ONDANSETRON HCL 4 MG/2ML IJ SOLN
4.0000 mg | Freq: Once | INTRAMUSCULAR | Status: AC
  Administered 2021-08-09: 4 mg via INTRAMUSCULAR

## 2021-08-09 MED ORDER — SODIUM CHLORIDE 0.9 % IV BOLUS
1000.0000 mL | Freq: Once | INTRAVENOUS | Status: AC
  Administered 2021-08-10: 1000 mL via INTRAVENOUS

## 2021-08-09 NOTE — ED Notes (Signed)
Patient is being discharged from the Urgent Care and sent to the Emergency Department via POV . Per Lelon Frohlich, NP, patient is in need of higher level of care due to nausea and vomiting. Patient is aware and verbalizes understanding of plan of care.  Vitals:   08/09/21 1719  BP: (!) 149/90  Pulse: 78  Resp: 18  Temp: 98.6 F (37 C)  SpO2: 99%

## 2021-08-09 NOTE — ED Triage Notes (Addendum)
Pt reports abd pain with bloody stools.  Hx hemorrhoids.  Sx for 1 week.    Pt sent from [facility] urgent care for eval.  Pt had injection of phenergan for nausea.   Today pt has vomiting.  No back pain  denies urinary sx.  Pt alert  speech clear.

## 2021-08-09 NOTE — ED Provider Notes (Signed)
[facility]    CSN: [id number] Arrival date & time: 08/09/21  1544      History   Chief Complaint Chief Complaint  Patient presents with   Nausea   Vomiting    HPI Robin Maxwell is a 21 y.o. female.   21 year old female accompanied by her Dad with concern over upper abdominal pain, nausea and vomiting that started this morning. She had been having occasional bright red blood in her stool over the past few weeks, usually with straining to pass harder stools. Today, she felt nauseous and has been vomiting up yellow bile continuously and has had diarrhea today with blood. Also has felt warm but no documented fever. Denies any upper respiratory or urinary symptoms. Has not been able to keep any fluids down since this morning. Has not taken any medication for symptoms. LMP 07/26/2021 and currently on Nexplanon. No tobacco, alcohol or illicit drug use. Has history of previous episodes of abdominal pain- thought to be due to gastritis or flare up of GERD.  Last episode here was in July 2022. Currently on Prilosec daily. Has not seen a GI specialist yet for symptoms. This episode of abdominal pain and vomiting is worse than previous episodes. No pertinent family history.   The history is provided by the patient and a parent.   Past Medical History:  Diagnosis Date   GERD (gastroesophageal reflux disease)     Patient Active Problem List   Diagnosis Date Noted   Nexplanon in place 11/24/2019   Clavicle fracture, shaft 06/17/2013    Past Surgical History:  Procedure Laterality Date   TONSILLECTOMY      OB History     Gravida  0   Para  0   Term  0   Preterm  0   AB  0   Living  0      SAB  0   IAB  0   Ectopic  0   Multiple  0   Live Births  0            Home Medications    Prior to Admission medications   Medication Sig Start Date End Date Taking? Authorizing Provider  etonogestrel (NEXPLANON) 68 MG IMPL implant 1 each by Subdermal  route once.   Yes [provider]  omeprazole (PRILOSEC) 20 MG capsule Take 1 capsule (20 mg total) by mouth daily. 01/06/21  Yes Rodriguez-Southworth, Sunday Spillers, PA-C    Family History Family History  Problem Relation Age of Onset   Breast cancer Neg Hx     Social History Social History   Tobacco Use   Smoking status: Never   Smokeless tobacco: Never  Vaping Use   Vaping Use: Some days   Devices: 1 cartridge = 2 wks  Substance Use Topics   Alcohol use: Not Currently    Comment: occasionally   Drug use: Not Currently    Types: Marijuana     Allergies   Patient has no known allergies.   Review of Systems Review of Systems  Constitutional:  Positive for activity change, appetite change, chills, diaphoresis and fatigue. Fever: felt warm but no documented fever. HENT:  Negative for sore throat and trouble swallowing.   Respiratory:  Negative for cough, shortness of breath and wheezing.   Gastrointestinal:  Positive for abdominal pain, blood in stool, diarrhea, nausea and vomiting.  Genitourinary:  Negative for decreased urine volume, difficulty urinating, dysuria, frequency, hematuria and vaginal discharge.  Musculoskeletal:  Negative for back pain and neck pain.  Skin:  Negative for color change and rash.  Allergic/Immunologic: Negative for environmental allergies, food allergies and immunocompromised state.  Neurological:  Positive for light-headedness. Negative for tremors, seizures, syncope, facial asymmetry, speech difficulty and numbness.  Hematological:  Negative for adenopathy. Does not bruise/bleed easily.    Physical Exam Triage Vital Signs ED Triage Vitals  Enc Vitals Group     BP 08/09/21 1719 (!) 149/90     Pulse Rate 08/09/21 1719 78     Resp 08/09/21 1719 18     Temp 08/09/21 1719 98.6 F (37 C)     Temp Source 08/09/21 1719 Oral     SpO2 08/09/21 1719 99 %     Weight 08/09/21 1719 140 lb (63.5 kg)     Height 08/09/21 1719 5\' 3"  (1.6 m)      Head Circumference --      Peak Flow --      Pain Score 08/09/21 1718 8     Pain Loc --      Pain Edu? --      Excl. in [place]? --    No data found.  Updated Vital Signs BP (!) 149/90 (BP Location: Left Arm)    Pulse 78    Temp 98.6 F (37 C) (Oral)    Resp 18    Ht 5\' 3"  (1.6 m)    Wt 140 lb (63.5 kg)    LMP 07/26/2021    SpO2 99%    BMI 24.80 kg/m   Visual Acuity Right Eye Distance:   Left Eye Distance:   Bilateral Distance:    Right Eye Near:   Left Eye Near:    Bilateral Near:     Physical Exam Vitals and nursing note reviewed.  Constitutional:      General: She is awake. She is in acute distress.     Appearance: She is well-developed. She is ill-appearing.     Comments: She is lying down and groaning and uncomfortable due to pain. She has vomited 3 times in the exam room over 10 minutes while trying to perform an exam.   HENT:     Head: Normocephalic and atraumatic.     Right Ear: Hearing normal.     Left Ear: Hearing normal.  Eyes:     Extraocular Movements: Extraocular movements intact.     Conjunctiva/sclera: Conjunctivae normal.  Cardiovascular:     Rate and Rhythm: Normal rate and regular rhythm.     Heart sounds: Normal heart sounds. No murmur heard. Pulmonary:     Effort: Pulmonary effort is normal. No respiratory distress.     Breath sounds: Normal breath sounds and air entry. No decreased air movement. No decreased breath sounds, wheezing, rhonchi or rales.  Abdominal:     General: Abdomen is flat. There is no distension.     Palpations: Abdomen is soft.     Tenderness: There is generalized abdominal tenderness. There is guarding. There is no right CVA tenderness or left CVA tenderness.     Comments: She is uncomfortable and very tender especially in upper central quadrant area. Difficulty performing exam since patient in significant pain and unable to lie on her back- patient in a more fetal position.   Musculoskeletal:     Cervical back: Normal range of  motion.  Skin:    General: Skin is warm and dry.     Capillary Refill: Capillary refill takes less than 2 seconds.  Findings: No rash.  Neurological:     General: No focal deficit present.     Mental Status: She is alert and oriented to person, place, and time.  Psychiatric:        Attention and Perception: Attention normal.        Behavior: Behavior is cooperative.        Thought Content: Thought content normal.        Cognition and Memory: Cognition and memory normal.     Comments: Patient responds with mainly 1 to 2 word answers since she is groaning and in significant pain and actively vomiting.      UC Treatments / Results  Labs (all labs ordered are listed, but only abnormal results are displayed) Labs Reviewed - No data to display  EKG   Radiology No results found.  Procedures Procedures (including critical care time)  Medications Ordered in UC Medications  ondansetron (ZOFRAN) injection 4 mg (4 mg Intramuscular Given 08/09/21 1825)    Initial Impression / Assessment and Plan / UC Course  I have reviewed the triage vital signs and the nursing notes.  Pertinent labs & imaging results that were available during my care of the patient were reviewed by me and considered in my medical decision making (see chart for details).     Discussed with Dad and patient, that we will trial Zofran IM but if limited help, she will need to go to the hospital for further work-up.  Gave Zofran 4mg  IM. After 20 minutes, patient did not vomit but she is still very nauseous and unable to keep down any fluids. She is still in significant pain and still groaning and lying in a fetal position. Vital signs are stable. May go to ER by personal vehicle. Recommend she go to the ER now for further evaluation. Patient and Dad agree with plan. Dad will take her to [place].  Final Clinical Impressions(s) / UC Diagnoses   Final diagnoses:  Abdominal pain, epigastric  Bilious  vomiting with nausea  Diarrhea, unspecified type     Discharge Instructions      Since you are still having nausea after Zofran medication and your pain level is still a 10 and unable to keep any fluids down, we recommend you go to the ER ASAP [facility] or [facility]) for further evaluation and treatment.      ED Prescriptions   None    PDMP not reviewed this encounter.   Katy Apo, NP 08/09/21 2257

## 2021-08-09 NOTE — Discharge Instructions (Addendum)
Since you are still having nausea after Zofran medication and your pain level is still a 10 and unable to keep any fluids down, we recommend you go to the ER ASAP [facility] or [facility]) for further evaluation and treatment.

## 2021-08-09 NOTE — ED Triage Notes (Signed)
Pt c/o constant vomiting and bright red blood in stool. Pt states that she has blood in stool for a couple of weeks.   Pt States that she has noticed  blood in her stool and vomiting since this morning. Pt states that this has happened before and has been seen here for this issues.

## 2021-08-09 NOTE — ED Notes (Signed)
Pt unable to void at this time. 

## 2021-08-10 ENCOUNTER — Encounter: Payer: Self-pay | Admitting: Internal Medicine

## 2021-08-10 ENCOUNTER — Emergency Department: Payer: Medicaid Other

## 2021-08-10 DIAGNOSIS — F17290 Nicotine dependence, other tobacco product, uncomplicated: Secondary | ICD-10-CM | POA: Diagnosis present

## 2021-08-10 DIAGNOSIS — J982 Interstitial emphysema: Secondary | ICD-10-CM | POA: Diagnosis present

## 2021-08-10 DIAGNOSIS — K529 Noninfective gastroenteritis and colitis, unspecified: Secondary | ICD-10-CM | POA: Diagnosis present

## 2021-08-10 DIAGNOSIS — Z20822 Contact with and (suspected) exposure to covid-19: Secondary | ICD-10-CM | POA: Diagnosis present

## 2021-08-10 DIAGNOSIS — X58XXXA Exposure to other specified factors, initial encounter: Secondary | ICD-10-CM | POA: Diagnosis present

## 2021-08-10 DIAGNOSIS — T797XXA Traumatic subcutaneous emphysema, initial encounter: Secondary | ICD-10-CM | POA: Diagnosis present

## 2021-08-10 LAB — GASTROINTESTINAL PANEL BY PCR, STOOL (REPLACES STOOL CULTURE)

## 2021-08-10 LAB — C-REACTIVE PROTEIN: CRP: 2.7 mg/dL — ABNORMAL HIGH (ref <1.0)

## 2021-08-10 LAB — CBC
HCT: 38.9 % (ref 36.0–46.0)
Hemoglobin: 13.4 g/dL (ref 12.0–15.0)
MCH: 31.5 pg (ref 26.0–34.0)
MCHC: 34.4 g/dL (ref 30.0–36.0)
MCV: 91.3 fL (ref 80.0–100.0)
Platelets: 190 10*3/uL (ref 150–400)
RBC: 4.26 MIL/uL (ref 3.87–5.11)
RDW: 12 % (ref 11.5–15.5)
WBC: 17.6 10*3/uL — ABNORMAL HIGH (ref 4.0–10.5)
nRBC: 0 % (ref 0.0–0.2)

## 2021-08-10 LAB — URINALYSIS, ROUTINE W REFLEX MICROSCOPIC
Bilirubin Urine: NEGATIVE
Glucose, UA: NEGATIVE mg/dL
Ketones, ur: 5 mg/dL — AB
Leukocytes,Ua: NEGATIVE
Nitrite: NEGATIVE
Protein, ur: 30 mg/dL — AB
Specific Gravity, Urine: >1.046 — ABNORMAL HIGH (ref 1.005–1.030)
pH: 6 (ref 5.0–8.0)

## 2021-08-10 LAB — RESP PANEL BY RT-PCR (FLU A&B, COVID) ARPGX2
Influenza A by PCR: NEGATIVE
Influenza B by PCR: NEGATIVE
SARS Coronavirus 2 by RT PCR: NEGATIVE

## 2021-08-10 LAB — C DIFFICILE QUICK SCREEN W PCR REFLEX
C Diff antigen: POSITIVE — AB
C Diff toxin: NEGATIVE

## 2021-08-10 LAB — CLOSTRIDIUM DIFFICILE BY PCR, REFLEXED: Toxigenic C. Difficile by PCR: NEGATIVE

## 2021-08-10 LAB — TSH: TSH: 0.453 u[IU]/mL (ref 0.350–4.500)

## 2021-08-10 LAB — SEDIMENTATION RATE: Sed Rate: 7 mm/hr (ref 0–20)

## 2021-08-10 LAB — MAGNESIUM: Magnesium: 1.8 mg/dL (ref 1.7–2.4)

## 2021-08-10 MED ORDER — NALOXONE HCL 2 MG/2ML IJ SOSY
0.4000 mg | PREFILLED_SYRINGE | INTRAMUSCULAR | Status: DC | PRN
Start: 2021-08-10 — End: 2021-08-11
  Filled 2021-08-10: qty 2

## 2021-08-10 MED ORDER — PIPERACILLIN-TAZOBACTAM 3.375 G IVPB 30 MIN
3.3750 g | Freq: Once | INTRAVENOUS | Status: AC
  Administered 2021-08-10: 3.375 g via INTRAVENOUS
  Filled 2021-08-10: qty 50

## 2021-08-10 MED ORDER — ONDANSETRON HCL 4 MG/2ML IJ SOLN: 4.0000 mg | Freq: Four times a day (QID) | INTRAMUSCULAR | Status: DC | PRN

## 2021-08-10 MED ORDER — SODIUM CHLORIDE 0.9 % IV SOLN: INTRAVENOUS | Status: DC

## 2021-08-10 MED ORDER — METOCLOPRAMIDE HCL 5 MG/ML IJ SOLN
10.0000 mg | Freq: Once | INTRAMUSCULAR | Status: AC
  Administered 2021-08-10: 10 mg via INTRAVENOUS
  Filled 2021-08-10: qty 2

## 2021-08-10 MED ORDER — PIPERACILLIN-TAZOBACTAM 3.375 G IVPB
3.3750 g | Freq: Three times a day (TID) | INTRAVENOUS | Status: DC
  Administered 2021-08-10 – 2021-08-11 (×3): 3.375 g via INTRAVENOUS
  Filled 2021-08-10 (×4): qty 50

## 2021-08-10 MED ORDER — FENTANYL CITRATE PF 50 MCG/ML IJ SOSY: 25.0000 ug | PREFILLED_SYRINGE | INTRAMUSCULAR | Status: DC | PRN

## 2021-08-10 MED ORDER — ONDANSETRON HCL 4 MG PO TABS: 4.0000 mg | ORAL_TABLET | Freq: Four times a day (QID) | ORAL | Status: DC | PRN

## 2021-08-10 MED ORDER — ACETAMINOPHEN 650 MG RE SUPP
650.0000 mg | Freq: Four times a day (QID) | RECTAL | Status: DC | PRN
Start: 2021-08-10 — End: 2021-08-11

## 2021-08-10 MED ORDER — ETONOGESTREL 68 MG ~~LOC~~ IMPL: 1.0000 | DRUG_IMPLANT | Freq: Once | SUBCUTANEOUS | Status: DC

## 2021-08-10 MED ORDER — MENTHOL 3 MG MT LOZG
1.0000 | LOZENGE | OROMUCOSAL | Status: DC | PRN
  Administered 2021-08-10: 3 mg via ORAL
  Filled 2021-08-10 (×2): qty 9

## 2021-08-10 MED ORDER — ONDANSETRON HCL 4 MG/2ML IJ SOLN
4.0000 mg | Freq: Four times a day (QID) | INTRAMUSCULAR | Status: DC | PRN
Start: 2021-08-10 — End: 2021-08-10

## 2021-08-10 MED ORDER — ACETAMINOPHEN 325 MG PO TABS: 650.0000 mg | ORAL_TABLET | Freq: Four times a day (QID) | ORAL | Status: DC | PRN

## 2021-08-10 MED ORDER — PANTOPRAZOLE SODIUM 40 MG IV SOLR
40.0000 mg | INTRAVENOUS | Status: DC
  Administered 2021-08-10: 40 mg via INTRAVENOUS
  Filled 2021-08-10 (×2): qty 10

## 2021-08-10 MED ORDER — IOHEXOL 300 MG/ML  SOLN
100.0000 mL | Freq: Once | INTRAMUSCULAR | Status: AC | PRN
  Administered 2021-08-10: 100 mL via INTRAVENOUS

## 2021-08-10 NOTE — Assessment & Plan Note (Signed)
Patient presents to the emergency room for evaluation of several days of sharp abdominal pain mostly periumbilical associated with nausea, emesis as well as bloody diarrhea. ?She has marked leukocytosis and imaging suggest possible colitis ?Send stool PCR as well as C. difficile toxin ?Patient was empirically started on Zosyn ?We will request GI consult ?

## 2021-08-10 NOTE — Consult Note (Signed)
GI Inpatient Consult Note  Reason for Consult: Diffuse abdominal pain, nausea, vomiting, and diarrhea   Attending Requesting Consult: Dr. Collier Bullock, MD  History of Present Illness: Robin Maxwell is a 21 y.o. female seen for evaluation of periumbilical abdominal pain, nausea, vomiting, and diarrhea at the request of admitting hospitalist - Dr. Collier Bullock. 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 She presented to the [facility] yesterday afternoon for chief complaint of acute onset epigastric/periumbilical abdominal pain with nausea, vomiting, and diarrhea which started yesterday morning after waking up. She was actively vomiting in the Urgent Care room and did not have relief of symptoms with Zofran IM so she was advised to present to the ED. Upon presentation to the ED, vital signs were all stable with no tachycardia, tachypnea, or fever. Labs were significant for leukocytosis (21.6K), hemoglobin 16.4, sodium 141, potassium 3.6, BUN 24, serum creatinine 0.65, total bilirubin 1.9. She had CT scan abd/pelvis with contrast which showed loose stool throughout the colon c/w diarrheal state and possible mild colitis vs underdistention of colon. There was also mention of visualized pneumomediastinum with air dissecting to left posterior pleural and left upper peritoneal space. Follow-up chest CT showed patent esophagus and findings most likely 2/2 sequelae of forced valsalva/barotrauma in setting of vomiting. She was empirically placed on IV Zosyn and given supportive care. GI consulted for further evaluation and management.   Patient seen and examined this afternoon resting in hospital bed. She denies any acute overnight events. She reports currently she is not having any abdominal pain, nausea, or recurrent episodes of vomiting. She has not had diarrhea since being in the ED. She reports yesterday she had seen some blood in her stool and this concerned  her. She reports the toilet water was dark red to bright red and also saw some on the tissue paper after wiping. She has not had any coffee-ground emesis or hematemesis. She denies any fevers or chills. No known family history of inflammatory bowel disease, colorectal cancer, or advanced adenomas. She reports having episodic self-limited periods of abdominal pain, nausea, vomiting, and diarrhea over the years but has never been as severe as it was yesterday. She does vape and she does smoke marijuana 3-4 times per week. She does drink EtOH socially. She also admits to using NSAIDs occasionally for pain control and minor aches and pains, but says this is not frequently. She does not take any daily medications. No sick contacts or recent travel. She did have Urgent Care visit 12/2020 for gastritis and a ED visit 05/2016 for vomiting and abdominal pain after drinking 6 alcoholic beverages at a friends house presumed 2/2 alcoholic gastritis. She has never had an EGD or colonoscopy. She denies any extra-intestinal manifestations such as vision changes, recurrent mouth ulcers, vision changes, new skin rashes, or unintentional weight loss.    Past Medical History:  Past Medical History:  Diagnosis Date   GERD (gastroesophageal reflux disease)     Problem List: Patient Active Problem List   Diagnosis Date Noted   Colitis 08/10/2021   Pneumomediastinum ([place]) 08/10/2021   Nexplanon in place 11/24/2019   Clavicle fracture, shaft 06/17/2013    Past Surgical History: Past Surgical History:  Procedure Laterality Date   TONSILLECTOMY      Allergies: No Known Allergies  Home Medications: (Not in a hospital admission)  Home medication reconciliation was completed with the patient.   Scheduled Inpatient Medications:    pantoprazole (  PROTONIX) IV  40 mg Intravenous Q24H    Continuous Inpatient Infusions:    sodium chloride Stopped (08/10/21 0918)   piperacillin-tazobactam (ZOSYN)  IV 3.375 g  (08/10/21 0921)    PRN Inpatient Medications:  acetaminophen **OR** acetaminophen, fentaNYL (SUBLIMAZE) injection, naLOXone (NARCAN)  injection, ondansetron **OR** ondansetron (ZOFRAN) IV  Family History: family history is not on file.  The patient's family history is negative for inflammatory bowel disorders, GI malignancy, or solid organ transplantation.  Social History:   reports that she has never smoked. She has never used smokeless tobacco. She reports that she does not currently use alcohol. She reports that she does not currently use drugs after having used the following drugs: Marijuana. The patient denies ETOH, tobacco, or drug use.   Review of Systems: Constitutional: Weight is stable.  Eyes: No changes in vision. ENT: No oral lesions, + sore throat.  GI: see HPI.  Heme/Lymph: No easy bruising.  CV: No chest pain.  GU: No hematuria.  Integumentary: No rashes.  Neuro: No headaches.  Psych: No depression/anxiety.  Endocrine: No heat/cold intolerance.  Allergic/Immunologic: No urticaria.  Resp: No cough, SOB.  Musculoskeletal: No joint swelling.    Physical Examination: BP 98/62 (BP Location: Left Arm)    Pulse 78    Temp 98.7 F (37.1 C) (Oral)    Resp 13    Ht '5\' 3"'  (1.6 m)    Wt 63.5 kg    LMP 07/26/2021 (Approximate) Comment: negative pregnancy test   SpO2 100%    BMI 24.80 kg/m  Gen: NAD, alert and oriented x 4 HEENT: PEERLA, EOMI, Neck: supple, no JVD or thyromegaly Chest: CTA bilaterally, no wheezes, crackles, or other adventitious sounds CV: RRR, no m/g/c/r Abd: soft, nondistended, +BS in all four quadrants; tender to deep palpation in epigastrium, no RUQ/LUQ/LLQ/LUQ TTP, no HSM, guarding, ridigity, or rebound tenderness Ext: no edema, well perfused with 2+ pulses, Skin: no rash or lesions noted Lymph: no LAD  Data: Lab Results  Component Value Date   WBC 21.6 (H) 08/09/2021   HGB 16.4 (H) 08/09/2021   HCT 46.6 (H) 08/09/2021   MCV 90.1 08/09/2021    PLT 235 08/09/2021   Recent Labs  Lab 08/09/21 1937  HGB 16.4*   Lab Results  Component Value Date   NA 141 08/09/2021   K 3.6 08/09/2021   CL 103 08/09/2021   CO2 23 08/09/2021   BUN 24 (H) 08/09/2021   CREATININE 0.65 08/09/2021   Lab Results  Component Value Date   ALT 18 08/09/2021   AST 19 08/09/2021   ALKPHOS 55 08/09/2021   BILITOT 1.9 (H) 08/09/2021   No results for input(s): APTT, INR, PTT in the last 168 hours.  CT abd/pelvis with contrast 08/10/21: IMPRESSION: 1. Partially visualized pneumomediastinum with air dissecting to the left posterior pleural space and probably into the left upper peritoneal space. Chest CT is recommended for better evaluation. 2. Loose stool throughout the colon consistent with diarrheal state. Correlation with clinical exam and stool cultures recommended. 3. Underdistention of the colon versus less likely mild colitis. Clinical correlation is recommended. No bowel obstruction. Normal appendix.  CT chest without contrast 08/10/21: IMPRESSION: Extensive Pneumomediastinum, with gas tracking from the mediastinum into other soft tissue spaces of the bilateral chest and visible neck.   But normal esophagus following PO contrast administration and the major airways appear patent and normal. No pleural or pericardial effusion, and both lungs are clear without pneumothorax.   This is most  compatible with sequelae of forced Valsalva/Barotrauma in the setting of vomiting.    Assessment/Plan:  21 y/o Caucasian female with a PMH of GERD and ED visit 05/2016 for alcohol-induced gastritis presented to the [facility] ED yesterday evening for chief complaint of acute epigastric/periumbilical abdominal pain with associated nausea, vomiting, and diarrhea.   Epigastric/Periumbilical abdominal pain with nausea/vomiting/diarrhea - DDx includes viral/bacterial gastroenteritis, small bowel enteritis, inflammatory bowel disease, hyperemesis cannabinoid  syndrome, IBS overlap, etc  Rectal bleeding - potentially 2/2 anal outlet etiology from internal hemorrhoids, viral/bacterial gastroenteritis, inflammatory bowel disease, etc  Mild colitis versus underdistention of colon  Leukocytosis - 21.6K, possibly 2/2 bacterial gastroenteritis, reactive, etc  Pneumomediastinum - 2/2 barotrauma from persistent nausea/vomiting/retching  Marijuana abuse  Recommendations:  - Agree with supportive care with pain control, IV fluid hydration, and empiric antibiotics to cover for bacterial gastroenteritis - Stool studies - GI PCR, c diff, fecal calprotectin - Will add on blood inflammatory markers (CRP, ESR) as well as thyroid hormone and celiac disease panel for completeness - Continue serial abdominal examinations  - Follow-up on results of stool studies  - Continue to follow clinically. She appears to be responding well to supportive care with improvements in GI symptoms.  - We will monitor her progress in the hospital closely and anticipate GI follow-up in outpatient setting to discuss elective colonoscopy. If she does not continue to improve or symptoms worsen, will likely need inpatient diagnostic colonoscopy - Discussed importance of cessation of marijuana  - Advance diet as tolerated  - Following along with you for now  Thank you for the consult. Please call with questions or concerns.  Reeves Forth [facility] [phone] (Cell)

## 2021-08-10 NOTE — H&P (Addendum)
?History and Physical  ? ? ?Patient: Robin Maxwell [id number] DOB: [birth year] ?DOA: 08/09/2021 ?DOS: the patient was seen and examined on 08/10/2021 ?PCP: Patient, No Pcp Per (Inactive)  ?Patient coming from: Home ? ?Chief Complaint:  ?Chief Complaint  ?Patient presents with  ? Abdominal Pain  ? ? ?HPI: Robin Maxwell is a 21 y.o. female with medical history significant for GERD who presents to the ER for evaluation of abdominal discomfort, nausea, emesis and occasional bloody stools. ?Patient states that she has had GI issues in the past and was told she had GERD.  She was started on Prilosec without any improvement in her symptoms. ?Over the last several days she has had sharp abdominal pain mostly periumbilical associated with nausea and refractory vomiting over the last 24 hours.  Emesis is mostly bilious without blood.  She states that she has had diarrhea sometimes mixed with blood.  She denies having any fever or chills.  She does not have a known history of inflammatory bowel disease.  She takes NSAIDs occasionally for pain control. ?She denies having any urinary frequency, no dysuria, no nocturia, no chest pain, no shortness of breath, no leg swelling, no palpitations or diaphoresis. ? ?Review of Systems: As mentioned in the history of present illness. All other systems reviewed and are negative. ?Past Medical History:  ?Diagnosis Date  ? GERD (gastroesophageal reflux disease)   ? ?Past Surgical History:  ?Procedure Laterality Date  ? TONSILLECTOMY    ? ?Social History:  reports that she has never smoked. She has never used smokeless tobacco. She reports that she does not currently use alcohol. She reports that she does not currently use drugs after having used the following drugs: Marijuana. ? ?No Known Allergies ? ?Family History  ?Problem Relation Age of Onset  ? Breast cancer Neg Hx   ? ? ?Prior to Admission medications   ?Medication Sig Start Date End Date Taking? Authorizing Provider   ?etonogestrel (NEXPLANON) 68 MG IMPL implant 1 each by Subdermal route once.   Yes [provider]  ?omeprazole (PRILOSEC) 20 MG capsule Take 1 capsule (20 mg total) by mouth daily. ?Patient not taking: Reported on 08/10/2021 01/06/21   Rodriguez-Southworth, Sunday Spillers, PA-C  ? ? ?Physical Exam: ?Vitals:  ? 08/10/21 0515 08/10/21 0700 08/10/21 0730 08/10/21 0920  ?BP: 118/87 (!) 87/51 (!) 107/41 98/62  ?Pulse: 70 (!) 58 79 78  ?Resp: 16  16 13   ?Temp:      ?TempSrc:      ?SpO2: 97% 98% 99% 100%  ?Weight:      ?Height:      ? ?Physical Exam ?Vitals and nursing note reviewed.  ?Constitutional:   ?   Appearance: She is well-developed and normal weight.  ?HENT:  ?   Head: Normocephalic and atraumatic.  ?Eyes:  ?   Pupils: Pupils are equal, round, and reactive to light.  ?Cardiovascular:  ?   Rate and Rhythm: Normal rate and regular rhythm.  ?Pulmonary:  ?   Effort: Pulmonary effort is normal.  ?Abdominal:  ?   General: Abdomen is flat. Bowel sounds are normal.  ?   Tenderness: There is abdominal tenderness in the epigastric area.  ?Skin: ?   General: Skin is warm and dry.  ?Neurological:  ?   General: No focal deficit present.  ?   Mental Status: She is alert.  ?Psychiatric:     ?   Mood and Affect: Mood normal.     ?   Behavior:  Behavior normal.  ? ? ? ?Data Reviewed: ?Data Reviewed: ?Relevant notes from primary care and specialist visits, past discharge summaries as available in EHR, including Care Everywhere. ?Prior diagnostic testing as pertinent to current admission diagnoses ?Updated medications and problem lists for reconciliation ?ED course, including vitals, labs, imaging, treatment and response to treatment ?Triage notes, nursing and pharmacy notes and ED provider's notes ?Notable results as noted in HPI ?Labs reviewed, serum glucose 185, BUN elevated at 24, white count 21.6, hemoglobin 16.4, hematocrit 46.6 ?CT scan of abdomen and pelvis reviewed by me shows partially visualized pneumomediastinum with air  dissecting to the left posterior pleural space and probably into the left upper peritoneal space. Chest CT is recommended for better evaluation. Loose stool throughout the colon consistent with diarrheal state.Correlation with clinical exam and stool cultures recommended. Under distention of the colon versus less likely mild colitis. ?CT scan of the chest without contrast shows extensive Pneumomediastinum, with gas tracking from the mediastinum into other soft tissue spaces of the bilateral chest and visible neck. ?But normal esophagus following PO contrast administration and the ?major airways appear patent and normal. No pleural or pericardial ?effusion, and both lungs are clear without pneumothorax. ?This is most compatible with sequelae of forced Valsalva/Barotrauma ?in the setting of vomiting. ?There are no new results to review at this time. ? ?Assessment and Plan: ?* Colitis- (present on admission) ?Patient presents to the emergency room for evaluation of several days of sharp abdominal pain mostly periumbilical associated with nausea, emesis as well as bloody diarrhea. ?She has marked leukocytosis and imaging suggest possible colitis ?Send stool PCR as well as C. difficile toxin ?Patient was empirically started on Zosyn ?We will request GI consult ? ?Pneumomediastinum ([place])- (present on admission) ?Most likely related to barotrauma from persistent nausea and vomiting ?Supportive care ?We will consult pulmonary ? ? ? ?Advance Care Planning:   Code Status: Full Code  ? ?Consults: GI ? ?Family Communication: Greater than 50% of time was spent discussing plan of care with patient at the bedside. All questions and concerns have been addressed. She verbalizes understanding and agrees with the plan ? ?Severity of Illness: ?The appropriate patient status for this patient is OBSERVATION. Observation status is judged to be reasonable and necessary in order to provide the required intensity of service to ensure the  patient's safety. The patient's presenting symptoms, physical exam findings, and initial radiographic and laboratory data in the context of their medical condition is felt to place them at decreased risk for further clinical deterioration. Furthermore, it is anticipated that the patient will be medically stable for discharge from the hospital within 2 midnights of admission.  ? ?Author: ?Collier Bullock, MD ?08/10/2021 12:23 PM ? ?For on call review [email or web address].  ?

## 2021-08-10 NOTE — Consult Note (Signed)
PULMONOLOGY         Date: 08/10/2021,   MRN# [id number] Robin Maxwell 03/03/2001     AdmissionWeight: 63.5 kg                 CurrentWeight: 63.5 kg   Referring physician: Dr Joylene Igo   CHIEF COMPLAINT:   Pneumomediastinum    HISTORY OF PRESENT ILLNESS   Patient is 21 yo F with GERD who complained of abdominal discomfort,nausea, vomiting and bloody diarreah. She denies flu like illness. He has not had similar episodes in the past. She denies illict drug use but admits to MJ smoking, she does use NSAIDS, she denies alcoholism. She had CT chest with pneumomediastinum but is essentially asymptomatic from respiratory perspective. PCCM consultation for pneumomediastium.    PAST MEDICAL HISTORY   Past Medical History:  Diagnosis Date   GERD (gastroesophageal reflux disease)      SURGICAL HISTORY   Past Surgical History:  Procedure Laterality Date   TONSILLECTOMY       FAMILY HISTORY   Family History  Problem Relation Age of Onset   Breast cancer Neg Hx      SOCIAL HISTORY   Social History   Tobacco Use   Smoking status: Never   Smokeless tobacco: Never  Vaping Use   Vaping Use: Some days   Devices: 1 cartridge = 2 wks  Substance Use Topics   Alcohol use: Not Currently    Comment: occasionally   Drug use: Not Currently    Types: Marijuana     MEDICATIONS    Home Medication:  Current Outpatient Rx   Order #: [id number] Class: Historical Med   Order #: [id number] Class: Normal    Current Medication:  Current Facility-Administered Medications:    0.9 %  sodium chloride infusion, , Intravenous, Continuous, Don Perking, [place], MD, Stopped at 08/10/21 6295   acetaminophen (TYLENOL) tablet 650 mg, 650 mg, Oral, Q6H PRN **OR** acetaminophen (TYLENOL) suppository 650 mg, 650 mg, Rectal, Q6H PRN, Howerter, Justin B, DO   fentaNYL (SUBLIMAZE) injection 25 mcg, 25 mcg, Intravenous, Q2H PRN, Howerter, Justin B, DO   naloxone  (NARCAN) injection 0.4 mg, 0.4 mg, Intravenous, PRN, Howerter, Justin B, DO   ondansetron (ZOFRAN) tablet 4 mg, 4 mg, Oral, Q6H PRN **OR** ondansetron (ZOFRAN) injection 4 mg, 4 mg, Intravenous, Q6H PRN, Agbata, Tochukwu, MD   pantoprazole (PROTONIX) injection 40 mg, 40 mg, Intravenous, Q24H, Agbata, Tochukwu, MD, 40 mg at 08/10/21 0917   piperacillin-tazobactam (ZOSYN) IVPB 3.375 g, 3.375 g, Intravenous, Q8H, Howerter, Justin B, DO, Last Rate: 12.5 mL/hr at 08/10/21 0921, 3.375 g at 08/10/21 2841  Current Outpatient Medications:    etonogestrel (NEXPLANON) 68 MG IMPL implant, 1 each by Subdermal route once., Disp: , Rfl:    omeprazole (PRILOSEC) 20 MG capsule, Take 1 capsule (20 mg total) by mouth daily. (Patient not taking: Reported on 08/10/2021), Disp: 30 capsule, Rfl: 0    ALLERGIES   Patient has no known allergies.     REVIEW OF SYSTEMS    Review of Systems:  Gen:  Denies  fever, sweats, chills weigh loss  HEENT: Denies blurred vision, double vision, ear pain, eye pain, hearing loss, nose bleeds, sore throat Cardiac:  No dizziness, chest pain or heaviness, chest tightness,edema Resp:   Denies cough or sputum porduction, shortness of breath,wheezing, hemoptysis,  Gi: Denies swallowing difficulty, stomach pain, nausea or vomiting, diarrhea, constipation, bowel incontinence Gu:  Denies bladder incontinence, burning urine Ext:   Denies Joint  pain, stiffness or swelling Skin: Denies  skin rash, easy bruising or bleeding or hives Endoc:  Denies polyuria, polydipsia , polyphagia or weight change Psych:   Denies depression, insomnia or hallucinations   Other:  All other systems negative   VS: BP 98/62 (BP Location: Left Arm)   Pulse 78   Temp 98.7 F (37.1 C) (Oral)   Resp 13   Ht 5\' 3"  (1.6 m)   Wt 63.5 kg   LMP 07/26/2021 (Approximate) Comment: negative pregnancy test  SpO2 100%   BMI 24.80 kg/m      PHYSICAL EXAM    GENERAL:NAD, no fevers, chills, no weakness  no fatigue HEAD: Normocephalic, atraumatic.  EYES: Pupils equal, round, reactive to light. Extraocular muscles intact. No scleral icterus.  MOUTH: Moist mucosal membrane. Dentition intact. No abscess noted.  EAR, NOSE, THROAT: Clear without exudates. No external lesions.  NECK: Supple. No thyromegaly. No nodules. No JVD.  PULMONARY: Diffuse coarse rhonchi right sided +wheezes CARDIOVASCULAR: S1 and S2. Regular rate and rhythm. No murmurs, rubs, or gallops. No edema. Pedal pulses 2+ bilaterally.  GASTROINTESTINAL: Soft, nontender, nondistended. No masses. Positive bowel sounds. No hepatosplenomegaly.  MUSCULOSKELETAL: No swelling, clubbing, or edema. Range of motion full in all extremities.  NEUROLOGIC: Cranial nerves II through XII are intact. No gross focal neurological deficits. Sensation intact. Reflexes intact.  SKIN: No ulceration, lesions, rashes, or cyanosis. Skin warm and dry. Turgor intact.  PSYCHIATRIC: Mood, affect within normal limits. The patient is awake, alert and oriented x 3. Insight, judgment intact.       IMAGING    CT CHEST WO CONTRAST  Result Date: 08/10/2021 CLINICAL DATA:  21 year old female with abdominal pain, nausea vomiting. Pneumomediastinum visible on CT Abdomen and Pelvis this morning. EXAM: CT CHEST WITHOUT CONTRAST TECHNIQUE: Multidetector CT imaging of the chest was performed following the standard protocol without IV contrast. RADIATION DOSE REDUCTION: This exam was performed according to the departmental dose-optimization program which includes automated exposure control, adjustment of the mA and/or kV according to patient size and/or use of iterative reconstruction technique. COMPARISON:  CT Abdomen and Pelvis 0328 hours. FINDINGS: Cardiovascular: No cardiomegaly or pericardial effusion. No IV contrast administered. Mediastinum/Nodes: Oral contrast was administered prior to this exam. There is trace residual oral contrast in the esophagus (on series 2, images  55, 93) and p.o. contrast documented in the stomach. There is extensive pneumomediastinum throughout. There is no contrast extravasation. There is no superimposed mediastinal soft tissue mass or lymphadenopathy. Lungs/Pleura: Pneumomediastinum tracking from the hila partially into the bilateral major fissures on series 2, image 53. But no bona fide pneumothorax. Normal lung volumes. Major airways are patent and unremarkable. No consolidation, pleural effusion, or abnormal pulmonary opacity identified. Upper Abdomen: PO contrast within the stomach. Excreted IV contrast in the bilateral kidneys. Otherwise stable since 03/20 8 hours visible upper abdominal viscera, noncontrast now. Musculoskeletal: Subcutaneous emphysema throughout the bilateral lower neck slightly greater on the left. Left greater than right axillary and chest wall subcutaneous gas. No superimposed axillary lymphadenopathy or soft tissue mass. No rib fracture or No osseous abnormality identified. IMPRESSION: Extensive Pneumomediastinum, with gas tracking from the mediastinum into other soft tissue spaces of the bilateral chest and visible neck. But normal esophagus following PO contrast administration and the major airways appear patent and normal. No pleural or pericardial effusion, and both lungs are clear without pneumothorax. 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 Electronically Signed   By: Rexene Edison  Margo Aye M.D.   On: 08/10/2021 05:11   CT ABDOMEN PELVIS W CONTRAST  Result Date: 08/10/2021 CLINICAL DATA:  Abdominal pain. Bloody stools. History of hemorrhoids. EXAM: CT ABDOMEN AND PELVIS WITH CONTRAST TECHNIQUE: Multidetector CT imaging of the abdomen and pelvis was performed using the standard protocol following bolus administration of intravenous contrast. RADIATION DOSE REDUCTION: This exam was performed according to the departmental dose-optimization program which includes automated exposure  control, adjustment of the mA and/or kV according to patient size and/or use of iterative reconstruction technique. CONTRAST:  OMNIPAQUE IOHEXOL 300 MG/ML  SOLN COMPARISON:  Chest radiograph dated 12/29/2018. FINDINGS: Lower chest: There is pneumomediastinum with air dissecting to the left posterior pleural space. There may be dissection of the tissue plane and extension of air into the peritoneal space posterior to the spleen. No intraperitoneal free fluid. Faint laceration of the left lower lobe pulmonary parenchyma. Hepatobiliary: Subcentimeter hypodense focus in the right lobe of the liver is too small to characterize. The liver is otherwise unremarkable. No intrahepatic biliary dilatation. The gallbladder is unremarkable. Pancreas: Unremarkable. No pancreatic ductal dilatation or surrounding inflammatory changes. Spleen: Normal in size without focal abnormality. Adrenals/Urinary Tract: Adrenal glands are unremarkable. Kidneys are normal, without renal calculi, focal lesion, or hydronephrosis. Bladder is unremarkable. Stomach/Bowel: Loose stool throughout the colon consistent with diarrheal state. Correlation with clinical exam and stool cultures recommended. Mild diffuse thickened appearance of the colon likely related to underdistention. Colitis is less likely. Clinical correlation is recommended. There is no bowel obstruction. The appendix is normal. Vascular/Lymphatic: The abdominal aorta and IVC are unremarkable. No portal venous gas. There is no adenopathy. Reproductive: The uterus is anteverted and grossly unremarkable. No adnexal masses. Other: None Musculoskeletal: No acute osseous pathology. There is subcutaneous air in the left anterior abdomen as well as superficial to the left rectus spinous muscle. No fluid collection. IMPRESSION: 1. Partially visualized pneumomediastinum with air dissecting to the left posterior pleural space and probably into the left upper peritoneal space. Chest CT is  recommended for better evaluation. 2. Loose stool throughout the colon consistent with diarrheal state. Correlation with clinical exam and stool cultures recommended. 3. Underdistention of the colon versus less likely mild colitis. Clinical correlation is recommended. No bowel obstruction. Normal appendix. These results were called by telephone at the time of interpretation on 08/10/2021 at 3:41 am to provider [facility] , who verbally acknowledged these results. Electronically Signed   By: Elgie Collard M.D.   On: 08/10/2021 03:48      ASSESSMENT/PLAN   Pneumomediastinum    - patient is with minimal respiratory symptoms    - she is currently on room air normoxic    - would favor supportive care for now with non NSAID analgesic such as tylenol.    - She should avoid any acitivity that may worsen shearing forces of airways such as flutter valve, incentive spirometry ETC.       Thank you for allowing me to participate in the care of this patient.  -Patient has at least 1 severe medical condition which is potentially life threatening and is being managed by me actively Patient/Family are satisfied with care plan and all questions have been answered.  This document was prepared using Dragon voice recognition software and may include unintentional dictation errors.     Vida Rigger, M.D.  Division of Pulmonary & Critical Care Medicine  [facility]

## 2021-08-10 NOTE — Progress Notes (Signed)
Pharmacy Antibiotic Note ? ?Robin Maxwell is a 21 y.o. female admitted on 08/09/2021 with  intra-abdominal .  Pharmacy has been consulted for Zosyn dosing. ? ?Plan: ?Zosyn 3.375 gm IV X 1 given in ED over 30 min on 3/1 @ 0357. ?Zosyn 3.375 gm IV Q8H EI ordered to continue on 3/1 @ 1000.  ? ?Height: 5\' 3"  (160 cm) ?Weight: 63.5 kg (140 lb) ?IBW/kg (Calculated) : 52.4 ? ?Temp (24hrs), Avg:98.7 ?F (37.1 ?C), Min:98.6 ?F (37 ?C), Max:98.7 ?F (37.1 ?C) ? ?Recent Labs  ?Lab 08/09/21 ?1937  ?WBC 21.6*  ?CREATININE 0.65  ?  ?Estimated Creatinine Clearance: 98.9 mL/min (by C-G formula based on SCr of 0.65 mg/dL).   ? ?No Known Allergies ? ?Antimicrobials this admission: ?  >>  ?  >>  ? ?Dose adjustments this admission: ? ? ?Microbiology results: ? BCx:  ? UCx:   ? Sputum:   ? MRSA PCR:  ? ?Thank you for allowing pharmacy to be a part of this patient?s care. ? ?Erinne Gillentine D ?08/10/2021 6:09 AM ? ?

## 2021-08-10 NOTE — Progress Notes (Signed)
?  Carryover admission to the Day Admitter.  I discussed this case with the EDP, Dr.Veronese.  Per these discussions: ? ?This is a 21 year old female who presents with 2 days of nausea, vomiting, abdominal pain, diarrhea, in the absence of hematemesis, melena, or hematochezia, who is being admitted for further evaluation and management colitis as well as pneumomediastinum.  ? ?CT abdomen/pelvis reportedly showed evidence of colitis, potentially infectious in nature, without evidence of bowel perforation, but otherwise no additional acute intra-abdominal process.  However, the proximal aspect of the CT abdomen/pelvis noted the presence of some pneumomediastinum.  Subsequently CT chest with oral contrast showed no evidence of esophageal perforation, but rather pneumomediastinum without pneumothorax, in the setting of barotrauma from recent forceful vomiting.  ? ?Vital signs reportedly stable.  Nausea improving with multiple doses of IV antiemetics in the ED.  Started on Zosyn. ? ?I have placed an order to admit for overnight observation to med telemetry. ? ?I have placed some additional preliminary admit orders via the adult multi-morbid admission order set.  Continuous NS at 150 cc/h ordered.  I have also ordered prn Zofran, add-on serum magnesium level, prn fentanyl, and continued Zosyn ordered by EDP.  Also ordered clear liquid diet, advance as tolerated.  Of note, COVID-19 PCR result pending. ? ? ? ?Newton Pigg, DO ?Hospitalist  ? ?

## 2021-08-10 NOTE — Assessment & Plan Note (Addendum)
Most likely related to barotrauma from persistent nausea and vomiting ?Supportive care ?We will consult pulmonary ?

## 2021-08-10 NOTE — ED Provider Notes (Signed)
[facility] Provider Note    Event Date/Time   First MD Initiated Contact with Patient 08/09/21 2353     (approximate)   History   Abdominal Pain   HPI  Robin Maxwell is a 21 y.o. female with history of GERD who presents for evaluation of abdominal pain.  Patient reports several episodes of similar pain over the last several weeks but usually did not last this longer at this severe.  This 1 started this morning.  She is complaining of diffuse sharp abdominal pain associated with nausea.  Has had several episodes of nonbloody nonbilious emesis.  She reports that these episodes of abdominal pain are usually accompanied by diarrhea.  Sometimes the diarrhea is bloody.  Her last episode of bloody diarrhea was 2 days ago.  She continues to have watery nonbloody diarrhea today.  She denies melena, coffee-ground emesis, or hematemesis.  No chest pain or shortness of breath.  No fever or chills.  No dysuria or hematuria.     Past Medical History:  Diagnosis Date   GERD (gastroesophageal reflux disease)     Past Surgical History:  Procedure Laterality Date   TONSILLECTOMY       Physical Exam   Triage Vital Signs: ED Triage Vitals  Enc Vitals Group     BP 08/09/21 1931 130/72     Pulse Rate 08/09/21 1931 61     Resp 08/09/21 1931 18     Temp 08/09/21 1931 98.7 F (37.1 C)     Temp Source 08/09/21 1931 Oral     SpO2 08/09/21 1931 100 %     Weight 08/09/21 1933 140 lb (63.5 kg)     Height 08/09/21 1933 5\' 3"  (1.6 m)     Head Circumference --      Peak Flow --      Pain Score 08/09/21 1940 9     Pain Loc --      Pain Edu? --      Excl. in [place]? --     Most recent vital signs: Vitals:   08/10/21 0345 08/10/21 0515  BP: 128/88 118/87  Pulse: 63 70  Resp: 18 16  Temp:    SpO2: 98% 97%     Constitutional: Alert and oriented. Well appearing and in no apparent distress. HEENT:      Head: Normocephalic and atraumatic.         Eyes:  Conjunctivae are normal. Sclera is non-icteric.       Mouth/Throat: Mucous membranes are moist.       Neck: Supple with no signs of meningismus. Cardiovascular: Regular rate and rhythm. No murmurs, gallops, or rubs. 2+ symmetrical distal pulses are present in all extremities.  Respiratory: Normal respiratory effort. Lungs are clear to auscultation bilaterally.  Gastrointestinal: Soft, diffusely tender to palpation, and non distended with positive bowel sounds. No rebound or guarding. Genitourinary: No CVA tenderness. Musculoskeletal:  No edema, cyanosis, or erythema of extremities. Neurologic: Normal speech and language. Face is symmetric. Moving all extremities. No gross focal neurologic deficits are appreciated. Skin: Skin is warm, dry and intact. No rash noted. Psychiatric: Mood and affect are normal. Speech and behavior are normal.  ED Results / Procedures / Treatments   Labs (all labs ordered are listed, but only abnormal results are displayed) Labs Reviewed  COMPREHENSIVE METABOLIC PANEL - Abnormal; Notable for the following components:      Result Value   Glucose, Bld 185 (*)    BUN 24 (*)  Albumin 5.1 (*)    Total Bilirubin 1.9 (*)    All other components within normal limits  CBC - Abnormal; Notable for the following components:   WBC 21.6 (*)    RBC 5.17 (*)    Hemoglobin 16.4 (*)    HCT 46.6 (*)    All other components within normal limits  URINALYSIS, ROUTINE W REFLEX MICROSCOPIC - Abnormal; Notable for the following components:   Color, Urine YELLOW (*)    APPearance HAZY (*)    Specific Gravity, Urine 1.033 (*)    Hgb urine dipstick MODERATE (*)    Ketones, ur 80 (*)    Protein, ur 30 (*)    Bacteria, UA RARE (*)    All other components within normal limits  URINE CULTURE  LIPASE, BLOOD  POC URINE PREG, ED     EKG  none   RADIOLOGY I, Rudene Re, attending MD, have personally viewed and interpreted the images obtained during this visit as  below:  CT chest, abdomen and pelvis consistent with extensive amount of pneumomediastinum and possible colitis   ___________________________________________________ Interpretation by Radiologist:  CT CHEST WO CONTRAST  Result Date: 08/10/2021 CLINICAL DATA:  21 year old female with abdominal pain, nausea vomiting. Pneumomediastinum visible on CT Abdomen and Pelvis this morning. EXAM: CT CHEST WITHOUT CONTRAST TECHNIQUE: Multidetector CT imaging of the chest was performed following the standard protocol without IV contrast. RADIATION DOSE REDUCTION: This exam was performed according to the departmental dose-optimization program which includes automated exposure control, adjustment of the mA and/or kV according to patient size and/or use of iterative reconstruction technique. COMPARISON:  CT Abdomen and Pelvis 0328 hours. FINDINGS: Cardiovascular: No cardiomegaly or pericardial effusion. No IV contrast administered. Mediastinum/Nodes: Oral contrast was administered prior to this exam. There is trace residual oral contrast in the esophagus (on series 2, images 55, 93) and p.o. contrast documented in the stomach. There is extensive pneumomediastinum throughout. There is no contrast extravasation. There is no superimposed mediastinal soft tissue mass or lymphadenopathy. Lungs/Pleura: Pneumomediastinum tracking from the hila partially into the bilateral major fissures on series 2, image 53. But no bona fide pneumothorax. Normal lung volumes. Major airways are patent and unremarkable. No consolidation, pleural effusion, or abnormal pulmonary opacity identified. Upper Abdomen: PO contrast within the stomach. Excreted IV contrast in the bilateral kidneys. Otherwise stable since 03/20 8 hours visible upper abdominal viscera, noncontrast now. Musculoskeletal: Subcutaneous emphysema throughout the bilateral lower neck slightly greater on the left. Left greater than right axillary and chest wall subcutaneous gas. No  superimposed axillary lymphadenopathy or soft tissue mass. No rib fracture or No osseous abnormality identified. IMPRESSION: Extensive Pneumomediastinum, with gas tracking from the mediastinum into other soft tissue spaces of the bilateral chest and visible neck. But normal esophagus following PO contrast administration and the major airways appear patent and normal. No pleural or pericardial effusion, and both lungs are clear without pneumothorax. 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 Electronically Signed   By: Genevie Ann M.D.   On: 08/10/2021 05:11   CT ABDOMEN PELVIS W CONTRAST  Result Date: 08/10/2021 CLINICAL DATA:  Abdominal pain. Bloody stools. History of hemorrhoids. EXAM: CT ABDOMEN AND PELVIS WITH CONTRAST TECHNIQUE: Multidetector CT imaging of the abdomen and pelvis was performed using the standard protocol following bolus administration of intravenous contrast. RADIATION DOSE REDUCTION: This exam was performed according to the departmental dose-optimization program which includes automated exposure control, adjustment of the mA and/or kV  according to patient size and/or use of iterative reconstruction technique. CONTRAST:  OMNIPAQUE IOHEXOL 300 MG/ML  SOLN COMPARISON:  Chest radiograph dated 12/29/2018. FINDINGS: Lower chest: There is pneumomediastinum with air dissecting to the left posterior pleural space. There may be dissection of the tissue plane and extension of air into the peritoneal space posterior to the spleen. No intraperitoneal free fluid. Faint laceration of the left lower lobe pulmonary parenchyma. Hepatobiliary: Subcentimeter hypodense focus in the right lobe of the liver is too small to characterize. The liver is otherwise unremarkable. No intrahepatic biliary dilatation. The gallbladder is unremarkable. Pancreas: Unremarkable. No pancreatic ductal dilatation or surrounding inflammatory changes. Spleen: Normal in size  without focal abnormality. Adrenals/Urinary Tract: Adrenal glands are unremarkable. Kidneys are normal, without renal calculi, focal lesion, or hydronephrosis. Bladder is unremarkable. Stomach/Bowel: Loose stool throughout the colon consistent with diarrheal state. Correlation with clinical exam and stool cultures recommended. Mild diffuse thickened appearance of the colon likely related to underdistention. Colitis is less likely. Clinical correlation is recommended. There is no bowel obstruction. The appendix is normal. Vascular/Lymphatic: The abdominal aorta and IVC are unremarkable. No portal venous gas. There is no adenopathy. Reproductive: The uterus is anteverted and grossly unremarkable. No adnexal masses. Other: None Musculoskeletal: No acute osseous pathology. There is subcutaneous air in the left anterior abdomen as well as superficial to the left rectus spinous muscle. No fluid collection. IMPRESSION: 1. Partially visualized pneumomediastinum with air dissecting to the left posterior pleural space and probably into the left upper peritoneal space. Chest CT is recommended for better evaluation. 2. Loose stool throughout the colon consistent with diarrheal state. Correlation with clinical exam and stool cultures recommended. 3. Underdistention of the colon versus less likely mild colitis. Clinical correlation is recommended. No bowel obstruction. Normal appendix. These results were called by telephone at the time of interpretation on 08/10/2021 at 3:41 am to provider [facility] , who verbally acknowledged these results. Electronically Signed   By: Elgie Collard M.D.   On: 08/10/2021 03:48      PROCEDURES:  Critical Care performed: Yes, see critical care procedure note(s)  .Critical Care Performed by: Nita Sickle, MD Authorized by: Nita Sickle, MD   Critical care provider statement:    Critical care time (minutes):  30   Critical care time was exclusive of:  Separately  billable procedures and treating other patients   Critical care was necessary to treat or prevent imminent or life-threatening deterioration of the following conditions:  Respiratory failure, circulatory failure, sepsis and shock   Critical care was time spent personally by me on the following activities:  Development of treatment plan with patient or surrogate, discussions with consultants, evaluation of patient's response to treatment, examination of patient, ordering and review of laboratory studies, ordering and review of radiographic studies, ordering and performing treatments and interventions, pulse oximetry, re-evaluation of patient's condition and review of old charts   I assumed direction of critical care for this patient from another provider in my specialty: no     Care discussed with: admitting provider      IMPRESSION / MDM / ASSESSMENT AND PLAN / ED COURSE  I reviewed the triage vital signs and the nursing notes.  21 y.o. female with history of GERD who presents for evaluation of diffuse sharp abdominal pain associated with nausea, nonbloody nonbilious emesis, and diarrhea.  Patient does describe several similar episodes over the last several weeks sometimes with bloody diarrhea.  Has had no melena, no coffee-ground emesis  or hematemesis.  She looks uncomfortable but in no distress, she has normal vital signs, abdomen is soft with mild diffuse tenderness, no localized tenderness, rebound or guarding  Ddx: Pancreatitis versus gallbladder pathology versus peptic ulcer disease versus gastritis versus diverticulitis versus colitis versus gastroenteritis versus UTI versus pyelonephritis versus appendicitis   Plan: CBC, CMP, lipase, urinalysis, pregnancy test, CT abdomen pelvis.  Will give IV fluids, IV Toradol, IV Zofran for symptom relief   MEDICATIONS GIVEN IN ED: Medications  0.9 %  sodium chloride infusion (has no administration in time range)  ketorolac (TORADOL) 30 MG/ML  injection 15 mg (15 mg Intravenous Given 08/10/21 0034)  ondansetron (ZOFRAN) injection 4 mg (4 mg Intravenous Given 08/10/21 0034)  sodium chloride 0.9 % bolus 1,000 mL (0 mLs Intravenous Stopped 08/10/21 0134)  iohexol (OMNIPAQUE) 300 MG/ML solution 100 mL (100 mLs Intravenous Contrast Given 08/10/21 0322)  metoCLOPramide (REGLAN) injection 10 mg (10 mg Intravenous Given 08/10/21 0304)  piperacillin-tazobactam (ZOSYN) IVPB 3.375 g (0 g Intravenous Stopped 08/10/21 0427)     ED COURSE: Initial CT abdomen pelvis showing colitis but also concerning for pneumomediastinum therefore radiologist recommended a CT of the chest with oral contrast.  That was done and shows significant barotrauma from vomiting with a large amount of pneumomediastinum but no signs of esophageal perforation.  Patient remains well-appearing and feels markedly improved.  No signs of sepsis with no fever or tachycardia.  She does have elevated white count of 21.6, no significant electrolyte derangements or signs of dehydration.  UA showed no signs of UTI.  Negative pregnancy test.  Due to the extent of her pneumomediastinum will consult the hospitalist for admission for observation.  Patient has been given IV Zosyn for colitis.  Her pain and nausea are markedly improved.  After discussion with the hospitalist patient was admitted to their service   Consults: Hospitalist   EMR reviewed including last visit with her primary care doctor in June 2021 for a well exam    FINAL CLINICAL IMPRESSION(S) / ED DIAGNOSES   Final diagnoses:  Colitis  Barotrauma, initial encounter  Pneumomediastinum ([place])     Rx / DC Orders   ED Discharge Orders     None        Note:  This document was prepared using Dragon voice recognition software and may include unintentional dictation errors.   Please note:  Patient was evaluated in Emergency Department today for the symptoms described in the history of present illness. Patient was evaluated in  the context of the global COVID-19 pandemic, which necessitated consideration that the patient might be at risk for infection with the SARS-CoV-2 virus that causes COVID-19. Institutional protocols and algorithms that pertain to the evaluation of patients at risk for COVID-19 are in a state of rapid change based on information released by regulatory bodies including the CDC and federal and state organizations. These policies and algorithms were followed during the patient's care in the ED.  Some ED evaluations and interventions may be delayed as a result of limited staffing during the pandemic.       Alfred Levins, [place], MD 08/10/21 [phone]

## 2021-08-11 DIAGNOSIS — A0811 Acute gastroenteropathy due to Norwalk agent: Secondary | ICD-10-CM

## 2021-08-11 DIAGNOSIS — K529 Noninfective gastroenteritis and colitis, unspecified: Secondary | ICD-10-CM | POA: Diagnosis not present

## 2021-08-11 LAB — BASIC METABOLIC PANEL
Anion gap: 8 (ref 5–15)
BUN: 13 mg/dL (ref 6–20)
CO2: 24 mmol/L (ref 22–32)
Calcium: 9 mg/dL (ref 8.9–10.3)
Chloride: 109 mmol/L (ref 98–111)
Creatinine, Ser: 0.78 mg/dL (ref 0.44–1.00)
GFR, Estimated: >60 mL/min (ref >60)
Glucose, Bld: 90 mg/dL (ref 70–99)
Potassium: 3.9 mmol/L (ref 3.5–5.1)
Sodium: 141 mmol/L (ref 135–145)

## 2021-08-11 LAB — URINE CULTURE: Culture: <10000 — AB

## 2021-08-11 LAB — HIV ANTIBODY (ROUTINE TESTING W REFLEX): HIV Screen 4th Generation wRfx: NONREACTIVE

## 2021-08-11 LAB — CBC
HCT: 36.5 % (ref 36.0–46.0)
Hemoglobin: 12.6 g/dL (ref 12.0–15.0)
MCH: 32.1 pg (ref 26.0–34.0)
MCHC: 34.5 g/dL (ref 30.0–36.0)
MCV: 93.1 fL (ref 80.0–100.0)
Platelets: 170 10*3/uL (ref 150–400)
RBC: 3.92 MIL/uL (ref 3.87–5.11)
RDW: 12.1 % (ref 11.5–15.5)
WBC: 12.9 10*3/uL — ABNORMAL HIGH (ref 4.0–10.5)
nRBC: 0 % (ref 0.0–0.2)

## 2021-08-11 MED ORDER — BENZONATATE 100 MG PO CAPS
100.0000 mg | ORAL_CAPSULE | Freq: Two times a day (BID) | ORAL | 0 refills | Status: AC
Start: 2021-08-11 — End: 2022-08-11

## 2021-08-11 MED ORDER — ONDANSETRON 4 MG PO TBDP: 4.0000 mg | ORAL_TABLET | Freq: Four times a day (QID) | ORAL | 0 refills | Status: AC | PRN

## 2021-08-11 NOTE — Progress Notes (Signed)
Patient medically cleared to d/c with discharge orders placed by MD.  Patient received AVS including education provided by Charge RN including changes to medications and follow up appointments with all questions and concerns answered by RN.  PIVs removed by Charge RN with patient awaiting arrival of father for d/c to home.  Patient has agreed to ambulate off the unit by herself. ?

## 2021-08-11 NOTE — Discharge Summary (Signed)
Robin Maxwell WUJ:[id number] DOB: 09/21/2000 DOA: 08/09/2021  PCP: Patient, No Pcp Per (Inactive)  Admit date: 08/09/2021 Discharge date: 08/11/2021  Time spent: 35 minutes  Recommendations for Outpatient Follow-up:  GI and pulmonology f/u      Discharge Diagnoses:  Principal Problem:   Colitis Active Problems:   Pneumomediastinum [facility](HCC)   Discharge Condition: improved  Diet recommendation: regular  Filed Weights   08/09/21 1933 08/09/21 1940 08/11/21 0500  Weight: 63.5 kg 63.5 kg 60.9 kg    History of present illness:  Robin KidneyKarley Alexis Knope is a 21 y.o. female with medical history significant for GERD who presents to the ER for evaluation of abdominal discomfort, nausea, emesis and occasional bloody stools. Patient states that she has had GI issues in the past and was told she had GERD.  She was started on Prilosec without any improvement in her symptoms. Over the last several days she has had sharp abdominal pain mostly periumbilical associated with nausea and refractory vomiting over the last 24 hours.  Emesis is mostly bilious without blood.  She states that she has had diarrhea sometimes mixed with blood.  She denies having any fever or chills.  She does not have a known history of inflammatory bowel disease.  She takes NSAIDs occasionally for pain control. She denies having any urinary frequency, no dysuria, no nocturia, no chest pain, no shortness of breath, no leg swelling, no palpitations or diaphoresis.  Hospital Course:  Patient presented with one day abdominal pain, nausea, and bloody diarrhea. CT consistent w/ mild colitis. Gi pathogen panel positive for norovirus. Patient responded well to IV fluids and anti-emetics. On day of discharge nausea/vomiting resolved, pain resolved, diarrhea much improved, tolerating diet. Plan will be supportive care and can f/u with GI. Patient was found to have asymptomatic pneumomediastinum likely 2/2 barotrauma from vomiting. Pulmonology  consulted, advised avoid coughing and other valsalva and repeat cxr in a few weeks to ensure resolution. As patient doesn't have a PCP I have provider her with contact information for pulmonology to arrange f/u with them.  Procedures: none   Consultations: GI, pulmonology  Discharge Exam: Vitals:   08/11/21 0500 08/11/21 0816  BP: 100/61 95/64  Pulse: 65 (!) 57  Resp: 18 18  Temp: 99 F (37.2 C) 98.7 F (37.1 C)  SpO2: 99% 99%    General: NAD Cardiovascular: RRR Respiratory: CTAB Abdomen: soft, non-tender  Discharge Instructions   Discharge Instructions     Diet general   Complete by: As directed    Increase activity slowly   Complete by: As directed       Allergies as of 08/11/2021   No Known Allergies      Medication List     TAKE these medications    benzonatate 100 MG capsule Commonly known as: Tessalon Perles Take 1 capsule (100 mg total) by mouth 2 (two) times daily.   Nexplanon 68 MG Impl implant Generic drug: etonogestrel 1 each by Subdermal route once.   ondansetron 4 MG disintegrating tablet Commonly known as: ZOFRAN-ODT Take 1 tablet (4 mg total) by mouth every 6 (six) hours as needed for nausea.       No Known Allergies  Follow-up Information     Brandonoledo, Boykin Nearingeodoro K, MD Follow up in 1 month(s).   Specialty: Gastroenterology Contact information: [address] [place] [address] [phone]         Vida RiggerAleskerov, Fuad, MD Follow up in 1 month(s).   Specialty: Pulmonary Disease Contact information:  [address] [place] [address] [phone]                  The results of significant diagnostics from this hospitalization (including imaging, microbiology, ancillary and laboratory) are listed below for reference.    Significant Diagnostic Studies: CT CHEST WO CONTRAST  Result Date: 08/10/2021 CLINICAL DATA:  21 year old female with abdominal pain, nausea vomiting. Pneumomediastinum visible on CT  Abdomen and Pelvis this morning. EXAM: CT CHEST WITHOUT CONTRAST TECHNIQUE: Multidetector CT imaging of the chest was performed following the standard protocol without IV contrast. RADIATION DOSE REDUCTION: This exam was performed according to the departmental dose-optimization program which includes automated exposure control, adjustment of the mA and/or kV according to patient size and/or use of iterative reconstruction technique. COMPARISON:  CT Abdomen and Pelvis 0328 hours. FINDINGS: Cardiovascular: No cardiomegaly or pericardial effusion. No IV contrast administered. Mediastinum/Nodes: Oral contrast was administered prior to this exam. There is trace residual oral contrast in the esophagus (on series 2, images 55, 93) and p.o. contrast documented in the stomach. There is extensive pneumomediastinum throughout. There is no contrast extravasation. There is no superimposed mediastinal soft tissue mass or lymphadenopathy. Lungs/Pleura: Pneumomediastinum tracking from the hila partially into the bilateral major fissures on series 2, image 53. But no bona fide pneumothorax. Normal lung volumes. Major airways are patent and unremarkable. No consolidation, pleural effusion, or abnormal pulmonary opacity identified. Upper Abdomen: PO contrast within the stomach. Excreted IV contrast in the bilateral kidneys. Otherwise stable since 03/20 8 hours visible upper abdominal viscera, noncontrast now. Musculoskeletal: Subcutaneous emphysema throughout the bilateral lower neck slightly greater on the left. Left greater than right axillary and chest wall subcutaneous gas. No superimposed axillary lymphadenopathy or soft tissue mass. No rib fracture or No osseous abnormality identified. IMPRESSION: Extensive Pneumomediastinum, with gas tracking from the mediastinum into other soft tissue spaces of the bilateral chest and visible neck. But normal esophagus following PO contrast administration and the major airways appear patent  and normal. No pleural or pericardial effusion, and both lungs are clear without pneumothorax. 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 Electronically Signed   By: Odessa Fleming M.D.   On: 08/10/2021 05:11   CT ABDOMEN PELVIS W CONTRAST  Result Date: 08/10/2021 CLINICAL DATA:  Abdominal pain. Bloody stools. History of hemorrhoids. EXAM: CT ABDOMEN AND PELVIS WITH CONTRAST TECHNIQUE: Multidetector CT imaging of the abdomen and pelvis was performed using the standard protocol following bolus administration of intravenous contrast. RADIATION DOSE REDUCTION: This exam was performed according to the departmental dose-optimization program which includes automated exposure control, adjustment of the mA and/or kV according to patient size and/or use of iterative reconstruction technique. CONTRAST:  OMNIPAQUE IOHEXOL 300 MG/ML  SOLN COMPARISON:  Chest radiograph dated 12/29/2018. FINDINGS: Lower chest: There is pneumomediastinum with air dissecting to the left posterior pleural space. There may be dissection of the tissue plane and extension of air into the peritoneal space posterior to the spleen. No intraperitoneal free fluid. Faint laceration of the left lower lobe pulmonary parenchyma. Hepatobiliary: Subcentimeter hypodense focus in the right lobe of the liver is too small to characterize. The liver is otherwise unremarkable. No intrahepatic biliary dilatation. The gallbladder is unremarkable. Pancreas: Unremarkable. No pancreatic ductal dilatation or surrounding inflammatory changes. Spleen: Normal in size without focal abnormality. Adrenals/Urinary Tract: Adrenal glands are unremarkable. Kidneys are normal, without renal calculi, focal lesion, or hydronephrosis. Bladder is unremarkable. Stomach/Bowel: Loose stool throughout  the colon consistent with diarrheal state. Correlation with clinical exam and stool cultures recommended. Mild diffuse thickened appearance  of the colon likely related to underdistention. Colitis is less likely. Clinical correlation is recommended. There is no bowel obstruction. The appendix is normal. Vascular/Lymphatic: The abdominal aorta and IVC are unremarkable. No portal venous gas. There is no adenopathy. Reproductive: The uterus is anteverted and grossly unremarkable. No adnexal masses. Other: None Musculoskeletal: No acute osseous pathology. There is subcutaneous air in the left anterior abdomen as well as superficial to the left rectus spinous muscle. No fluid collection. IMPRESSION: 1. Partially visualized pneumomediastinum with air dissecting to the left posterior pleural space and probably into the left upper peritoneal space. Chest CT is recommended for better evaluation. 2. Loose stool throughout the colon consistent with diarrheal state. Correlation with clinical exam and stool cultures recommended. 3. Underdistention of the colon versus less likely mild colitis. Clinical correlation is recommended. No bowel obstruction. Normal appendix. These results were called by telephone at the time of interpretation on 08/10/2021 at 3:41 am to provider [facility] , who verbally acknowledged these results. Electronically Signed   By: Elgie Collard M.D.   On: 08/10/2021 03:48    Microbiology: Recent Results (from the past 240 hour(s))  Urine Culture     Status: Abnormal   Collection Time: 08/09/21  7:54 PM   Specimen: Urine, Random  Result Value Ref Range Status   Specimen Description   Final    URINE, RANDOM Performed at [facility], [address], [place], [place] [address]    Special Requests   Final    NONE Performed at [facility], [address], [place], [place] [address]    Culture (A)  Final    <10,000 COLONIES/mL INSIGNIFICANT GROWTH Performed at [facility], [address], [place], [place] [address]    Report Status 08/11/2021 FINAL  Final  Resp Panel by RT-PCR (Flu A&B, Covid)  Nasopharyngeal Swab     Status: None   Collection Time: 08/10/21  6:36 AM   Specimen: Nasopharyngeal Swab; Nasopharyngeal(NP) swabs in vial transport medium  Result Value Ref Range Status   SARS Coronavirus 2 by RT PCR NEGATIVE NEGATIVE Final    Comment: (NOTE) SARS-CoV-2 target nucleic acids are NOT DETECTED.  The SARS-CoV-2 RNA is generally detectable in upper respiratory specimens during the acute phase of infection. The lowest concentration of SARS-CoV-2 viral copies this assay can detect is 138 copies/mL. A negative result does not preclude SARS-Cov-2 infection and should not be used as the sole basis for treatment or other patient management decisions. A negative result may occur with  improper specimen collection/handling, submission of specimen other than nasopharyngeal swab, presence of viral mutation(s) within the areas targeted by this assay, and inadequate number of viral copies(<138 copies/mL). A negative result must be combined with clinical observations, patient history, and epidemiological information. The expected result is Negative.  Fact Sheet for Patients:  BloggerCourse.com  Fact Sheet for Healthcare Providers:  SeriousBroker.it  This test is no t yet approved or cleared by the [place] FDA and  has been authorized for detection and/or diagnosis of SARS-CoV-2 by FDA under an Emergency Use Authorization (EUA). This EUA will remain  in effect (meaning this test can be used) for the duration of the COVID-19 declaration under Section 564(b)(1) of the Act, 21 U.S.C.section 360bbb-3(b)(1), unless the authorization is terminated  or revoked sooner.       Influenza A by PCR NEGATIVE  NEGATIVE Final   Influenza B by PCR NEGATIVE NEGATIVE Final    Comment: (NOTE) The Xpert Xpress SARS-CoV-2/FLU/RSV plus assay is intended as an aid in the diagnosis of influenza from Nasopharyngeal swab specimens and should not be  used as a sole basis for treatment. Nasal washings and aspirates are unacceptable for Xpert Xpress SARS-CoV-2/FLU/RSV testing.  Fact Sheet for Patients: BloggerCourse.comhttps://www.fda.gov/media/152166/download  Fact Sheet for Healthcare Providers: SeriousBroker.ithttps://www.fda.gov/media/152162/download  This test is not yet approved or cleared by the [place] FDA and has been authorized for detection and/or diagnosis of SARS-CoV-2 by FDA under an Emergency Use Authorization (EUA). This EUA will remain in effect (meaning this test can be used) for the duration of the COVID-19 declaration under Section 564(b)(1) of the Act, 21 U.S.C. section 360bbb-3(b)(1), unless the authorization is terminated or revoked.  Performed at [facility], [address] [place], [place] [address]   Gastrointestinal Panel by PCR , Stool     Status: Abnormal   Collection Time: 08/10/21  3:51 PM   Specimen: Stool  Result Value Ref Range Status   Campylobacter species NOT DETECTED NOT DETECTED Final   Plesimonas shigelloides NOT DETECTED NOT DETECTED Final   Salmonella species NOT DETECTED NOT DETECTED Final   Yersinia enterocolitica NOT DETECTED NOT DETECTED Final   Vibrio species NOT DETECTED NOT DETECTED Final   Vibrio cholerae NOT DETECTED NOT DETECTED Final   Enteroaggregative E coli (EAEC) NOT DETECTED NOT DETECTED Final   Enteropathogenic E coli (EPEC) NOT DETECTED NOT DETECTED Final   Enterotoxigenic E coli (ETEC) NOT DETECTED NOT DETECTED Final   Shiga like toxin producing E coli (STEC) NOT DETECTED NOT DETECTED Final   Shigella/Enteroinvasive E coli (EIEC) NOT DETECTED NOT DETECTED Final   Cryptosporidium NOT DETECTED NOT DETECTED Final   Cyclospora cayetanensis NOT DETECTED NOT DETECTED Final   Entamoeba histolytica NOT DETECTED NOT DETECTED Final   Giardia lamblia NOT DETECTED NOT DETECTED Final   Adenovirus F40/41 NOT DETECTED NOT DETECTED Final   Astrovirus NOT DETECTED NOT DETECTED Final   Norovirus  GI/GII DETECTED (A) NOT DETECTED Final    Comment: RESULT CALLED TO, READ BACK BY AND VERIFIED WITH: OLIVIA RODGERS 08/10/21 1807 MU    Rotavirus A NOT DETECTED NOT DETECTED Final   Sapovirus (I, II, IV, and V) NOT DETECTED NOT DETECTED Final    Comment: Performed at [facility], [address] [place], [place] [address]  C Difficile Quick Screen w PCR reflex     Status: Abnormal   Collection Time: 08/10/21  3:51 PM   Specimen: Stool  Result Value Ref Range Status   C Diff antigen POSITIVE (A) NEGATIVE Final   C Diff toxin NEGATIVE NEGATIVE Final   C Diff interpretation Results are indeterminate. See PCR results.  Final    Comment: Performed at [facility], [address] [place], [place] [address]  C. Diff by PCR, Reflexed     Status: None   Collection Time: 08/10/21  3:51 PM  Result Value Ref Range Status   Toxigenic C. Difficile by PCR NEGATIVE NEGATIVE Final    Comment: Patient is colonized with non toxigenic C. difficile. May not need treatment unless significant symptoms are present. Performed at [facility], [address] [place], [place] [address]      Labs: Basic Metabolic Panel: Recent Labs  Lab 08/09/21 1937 08/11/21 0509  NA 141 141  K 3.6 3.9  CL 103 109  CO2 23 24  GLUCOSE 185* 90  BUN 24* 13  CREATININE 0.65 0.78  CALCIUM 10.1 9.0  MG 1.8  --    Liver Function Tests: Recent Labs  Lab 08/09/21 1937  AST 19  ALT 18  ALKPHOS 55  BILITOT 1.9*  PROT 8.0  ALBUMIN 5.1*   Recent Labs  Lab 08/09/21 1937  LIPASE 25   No results for input(s): AMMONIA in the last 168 hours. CBC: Recent Labs  Lab 08/09/21 1937 08/10/21 1517 08/11/21 0509  WBC 21.6* 17.6* 12.9*  HGB 16.4* 13.4 12.6  HCT 46.6* 38.9 36.5  MCV 90.1 91.3 93.1  PLT 235 190 170   Cardiac Enzymes: No results for input(s): CKTOTAL, CKMB, CKMBINDEX, TROPONINI in the last 168 hours. BNP: BNP (last 3 results) No results for input(s): BNP in the last  8760 hours.  ProBNP (last 3 results) No results for input(s): PROBNP in the last 8760 hours.  CBG: No results for input(s): GLUCAP in the last 168 hours.     Signed:  Silvano Bilis MD.  [facility] 08/11/2021, 9:04 AM

## 2021-08-11 NOTE — TOC CM/SW Note (Signed)
Patient has orders to discharge home today. Chart reviewed. On room air. No wounds. No TOC needs identified. CSW signing off. ° °Alicyn Klann, CSW °[phone] ° °

## 2021-08-11 NOTE — Progress Notes (Signed)
PULMONOLOGY         Date: 08/11/2021,   MRN# [id number] Robin Maxwell 07-10-2000     AdmissionWeight: 63.5 kg                 CurrentWeight: 60.9 kg   Referring physician: Dr Joylene Igo   CHIEF COMPLAINT:   Pneumomediastinum    HISTORY OF PRESENT ILLNESS   Patient is 21 yo F with GERD who complained of abdominal discomfort,nausea, vomiting and bloody diarreah. She denies flu like illness. He has not had similar episodes in the past. She denies illict drug use but admits to MJ smoking, she does use NSAIDS, she denies alcoholism. She had CT chest with pneumomediastinum but is essentially asymptomatic from respiratory perspective. PCCM consultation for pneumomediastium.   08/11/21- patient in no distress on room air. We discussed importance of reducing any shearing forces of airway including no vaping/smoking, cough suppression and to NOT use incentive spirometry or flutter valve.  Most cases of asymptomatic pneumomediastinum require supportive care only. She is stable for dc home from pulmonary perspective and may follow up with CXR to confirm resolution within 4wks  PAST MEDICAL HISTORY   Past Medical History:  Diagnosis Date   GERD (gastroesophageal reflux disease)      SURGICAL HISTORY   Past Surgical History:  Procedure Laterality Date   TONSILLECTOMY       FAMILY HISTORY   Family History  Problem Relation Age of Onset   Breast cancer Neg Hx      SOCIAL HISTORY   Social History   Tobacco Use   Smoking status: Never   Smokeless tobacco: Never  Vaping Use   Vaping Use: Some days   Devices: 1 cartridge = 2 wks  Substance Use Topics   Alcohol use: Not Currently    Comment: occasionally   Drug use: Not Currently    Types: Marijuana     MEDICATIONS    Home Medication:     Current Medication:  Current Facility-Administered Medications:    0.9 %  sodium chloride infusion, , Intravenous, Continuous, Don Perking, [place], MD, Last Rate: 150  mL/hr at 08/11/21 0242, New Bag at 08/11/21 0242   acetaminophen (TYLENOL) tablet 650 mg, 650 mg, Oral, Q6H PRN **OR** acetaminophen (TYLENOL) suppository 650 mg, 650 mg, Rectal, Q6H PRN, Howerter, Justin B, DO   fentaNYL (SUBLIMAZE) injection 25 mcg, 25 mcg, Intravenous, Q2H PRN, Howerter, Justin B, DO   menthol-cetylpyridinium (CEPACOL) lozenge 3 mg, 1 lozenge, Oral, PRN, Agbata, Tochukwu, MD, 3 mg at 08/10/21 2213   naloxone (NARCAN) injection 0.4 mg, 0.4 mg, Intravenous, PRN, Howerter, Justin B, DO   ondansetron (ZOFRAN) tablet 4 mg, 4 mg, Oral, Q6H PRN **OR** ondansetron (ZOFRAN) injection 4 mg, 4 mg, Intravenous, Q6H PRN, Agbata, Tochukwu, MD   pantoprazole (PROTONIX) injection 40 mg, 40 mg, Intravenous, Q24H, Agbata, Tochukwu, MD, 40 mg at 08/10/21 0917   piperacillin-tazobactam (ZOSYN) IVPB 3.375 g, 3.375 g, Intravenous, Q8H, Howerter, Justin B, DO, Last Rate: 12.5 mL/hr at 08/11/21 0245, 3.375 g at 08/11/21 0245    ALLERGIES   Patient has no known allergies.     REVIEW OF SYSTEMS    Review of Systems:  Gen:  Denies  fever, sweats, chills weigh loss  HEENT: Denies blurred vision, double vision, ear pain, eye pain, hearing loss, nose bleeds, sore throat Cardiac:  No dizziness, chest pain or heaviness, chest tightness,edema Resp:   Denies cough or sputum porduction, shortness of breath,wheezing, hemoptysis,  Gi: Denies  swallowing difficulty, stomach pain, nausea or vomiting, diarrhea, constipation, bowel incontinence Gu:  Denies bladder incontinence, burning urine Ext:   Denies Joint pain, stiffness or swelling Skin: Denies  skin rash, easy bruising or bleeding or hives Endoc:  Denies polyuria, polydipsia , polyphagia or weight change Psych:   Denies depression, insomnia or hallucinations   Other:  All other systems negative   VS: BP 100/61 (BP Location: Right Arm)    Pulse 65    Temp 99 F (37.2 C) (Oral)    Resp 18    Ht 5\' 3"  (1.6 m)    Wt 60.9 kg    LMP 07/26/2021  (Approximate) Comment: negative pregnancy test   SpO2 99%    BMI 23.78 kg/m      PHYSICAL EXAM    GENERAL:NAD, no fevers, chills, no weakness no fatigue HEAD: Normocephalic, atraumatic.  EYES: Pupils equal, round, reactive to light. Extraocular muscles intact. No scleral icterus.  MOUTH: Moist mucosal membrane. Dentition intact. No abscess noted.  EAR, NOSE, THROAT: Clear without exudates. No external lesions.  NECK: Supple. No thyromegaly. No nodules. No JVD.  PULMONARY: Diffuse coarse rhonchi right sided +wheezes CARDIOVASCULAR: S1 and S2. Regular rate and rhythm. No murmurs, rubs, or gallops. No edema. Pedal pulses 2+ bilaterally.  GASTROINTESTINAL: Soft, nontender, nondistended. No masses. Positive bowel sounds. No hepatosplenomegaly.  MUSCULOSKELETAL: No swelling, clubbing, or edema. Range of motion full in all extremities.  NEUROLOGIC: Cranial nerves II through XII are intact. No gross focal neurological deficits. Sensation intact. Reflexes intact.  SKIN: No ulceration, lesions, rashes, or cyanosis. Skin warm and dry. Turgor intact.  PSYCHIATRIC: Mood, affect within normal limits. The patient is awake, alert and oriented x 3. Insight, judgment intact.       IMAGING    CT CHEST WO CONTRAST  Result Date: 08/10/2021 CLINICAL DATA:  21 year old female with abdominal pain, nausea vomiting. Pneumomediastinum visible on CT Abdomen and Pelvis this morning. EXAM: CT CHEST WITHOUT CONTRAST TECHNIQUE: Multidetector CT imaging of the chest was performed following the standard protocol without IV contrast. RADIATION DOSE REDUCTION: This exam was performed according to the departmental dose-optimization program which includes automated exposure control, adjustment of the mA and/or kV according to patient size and/or use of iterative reconstruction technique. COMPARISON:  CT Abdomen and Pelvis 0328 hours. FINDINGS: Cardiovascular: No cardiomegaly or pericardial effusion. No IV contrast  administered. Mediastinum/Nodes: Oral contrast was administered prior to this exam. There is trace residual oral contrast in the esophagus (on series 2, images 55, 93) and p.o. contrast documented in the stomach. There is extensive pneumomediastinum throughout. There is no contrast extravasation. There is no superimposed mediastinal soft tissue mass or lymphadenopathy. Lungs/Pleura: Pneumomediastinum tracking from the hila partially into the bilateral major fissures on series 2, image 53. But no bona fide pneumothorax. Normal lung volumes. Major airways are patent and unremarkable. No consolidation, pleural effusion, or abnormal pulmonary opacity identified. Upper Abdomen: PO contrast within the stomach. Excreted IV contrast in the bilateral kidneys. Otherwise stable since 03/20 8 hours visible upper abdominal viscera, noncontrast now. Musculoskeletal: Subcutaneous emphysema throughout the bilateral lower neck slightly greater on the left. Left greater than right axillary and chest wall subcutaneous gas. No superimposed axillary lymphadenopathy or soft tissue mass. No rib fracture or No osseous abnormality identified. IMPRESSION: Extensive Pneumomediastinum, with gas tracking from the mediastinum into other soft tissue spaces of the bilateral chest and visible neck. But normal esophagus following PO contrast administration and the major airways appear patent and normal. No  pleural or pericardial effusion, and both lungs are clear without pneumothorax. 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 Electronically Signed   By: Odessa Fleming M.D.   On: 08/10/2021 05:11   CT ABDOMEN PELVIS W CONTRAST  Result Date: 08/10/2021 CLINICAL DATA:  Abdominal pain. Bloody stools. History of hemorrhoids. EXAM: CT ABDOMEN AND PELVIS WITH CONTRAST TECHNIQUE: Multidetector CT imaging of the abdomen and pelvis was performed using the standard protocol following bolus administration of  intravenous contrast. RADIATION DOSE REDUCTION: This exam was performed according to the departmental dose-optimization program which includes automated exposure control, adjustment of the mA and/or kV according to patient size and/or use of iterative reconstruction technique. CONTRAST:  OMNIPAQUE IOHEXOL 300 MG/ML  SOLN COMPARISON:  Chest radiograph dated 12/29/2018. FINDINGS: Lower chest: There is pneumomediastinum with air dissecting to the left posterior pleural space. There may be dissection of the tissue plane and extension of air into the peritoneal space posterior to the spleen. No intraperitoneal free fluid. Faint laceration of the left lower lobe pulmonary parenchyma. Hepatobiliary: Subcentimeter hypodense focus in the right lobe of the liver is too small to characterize. The liver is otherwise unremarkable. No intrahepatic biliary dilatation. The gallbladder is unremarkable. Pancreas: Unremarkable. No pancreatic ductal dilatation or surrounding inflammatory changes. Spleen: Normal in size without focal abnormality. Adrenals/Urinary Tract: Adrenal glands are unremarkable. Kidneys are normal, without renal calculi, focal lesion, or hydronephrosis. Bladder is unremarkable. Stomach/Bowel: Loose stool throughout the colon consistent with diarrheal state. Correlation with clinical exam and stool cultures recommended. Mild diffuse thickened appearance of the colon likely related to underdistention. Colitis is less likely. Clinical correlation is recommended. There is no bowel obstruction. The appendix is normal. Vascular/Lymphatic: The abdominal aorta and IVC are unremarkable. No portal venous gas. There is no adenopathy. Reproductive: The uterus is anteverted and grossly unremarkable. No adnexal masses. Other: None Musculoskeletal: No acute osseous pathology. There is subcutaneous air in the left anterior abdomen as well as superficial to the left rectus spinous muscle. No fluid collection. IMPRESSION: 1.  Partially visualized pneumomediastinum with air dissecting to the left posterior pleural space and probably into the left upper peritoneal space. Chest CT is recommended for better evaluation. 2. Loose stool throughout the colon consistent with diarrheal state. Correlation with clinical exam and stool cultures recommended. 3. Underdistention of the colon versus less likely mild colitis. Clinical correlation is recommended. No bowel obstruction. Normal appendix. These results were called by telephone at the time of interpretation on 08/10/2021 at 3:41 am to provider [facility] , who verbally acknowledged these results. Electronically Signed   By: Elgie Collard M.D.   On: 08/10/2021 03:48      ASSESSMENT/PLAN   Pneumomediastinum    - patient is with minimal respiratory symptoms    - she is currently on room air normoxic    - would favor supportive care for now with non NSAID analgesic such as tylenol.    - She should avoid any acitivity that may worsen shearing forces of airways such as flutter valve, incentive spirometry ETC.  -cxr in 4 wks     Thank you for allowing me to participate in the care of this patient.  -Patient has at least 1 severe medical condition which is potentially life threatening and is being managed by me actively Patient/Family are satisfied with care plan and all questions have been answered.  This document was prepared using Dragon voice recognition software and may include unintentional dictation  errors.     Ottie Glazier, M.D.  Division of [place]

## 2021-08-12 LAB — CELIAC DISEASE PANEL
Endomysial Ab, IgA: NEGATIVE
IgA: 102 mg/dL (ref 87–352)
Tissue Transglutaminase Ab, IgA: <2 U/mL (ref 0–3)

## 2021-08-13 LAB — CALPROTECTIN, FECAL: Calprotectin, Fecal: 70 ug/g (ref 0–120)

## 2022-06-22 IMAGING — CT CT CHEST W/O CM
2 of 4 series · 15 of 36 positions shown, 18 images · non-contrast
Comparison: CT Abdomen and Pelvis 4281 hours.

CLINICAL DATA: 20-year-old female with abdominal pain, nausea
vomiting. Pneumomediastinum visible on CT Abdomen and Pelvis this
morning.



[Series 2: chest wo · axial · 0.78mm/px · z∈[-740,-484]mm · 12 of 152 slices shown, 15 images]
[im 12/152  mediastinal]
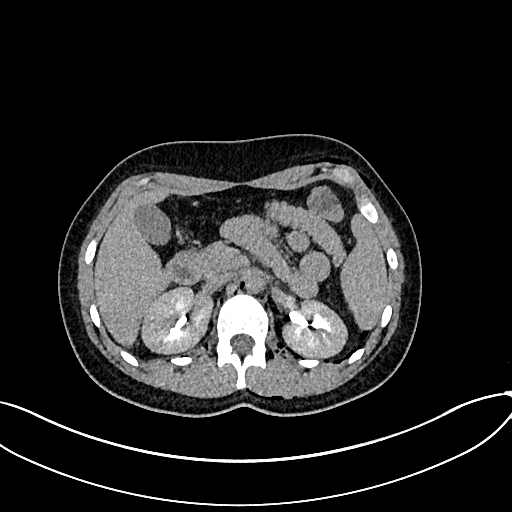
[im 12/152  lung]
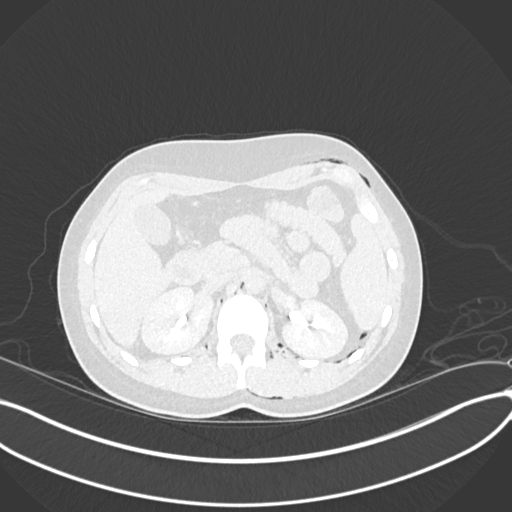
[im 24/152  lung]
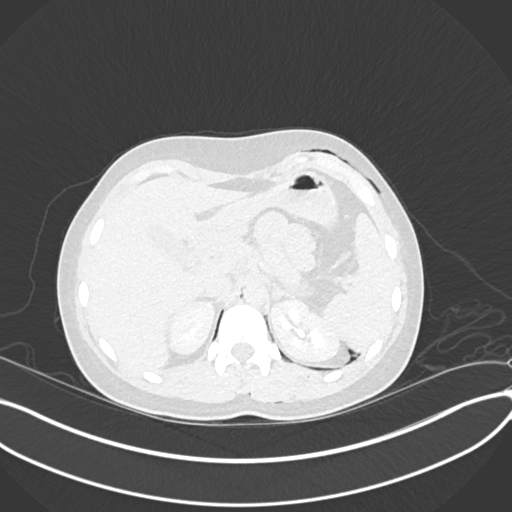
[im 35/152  lung]
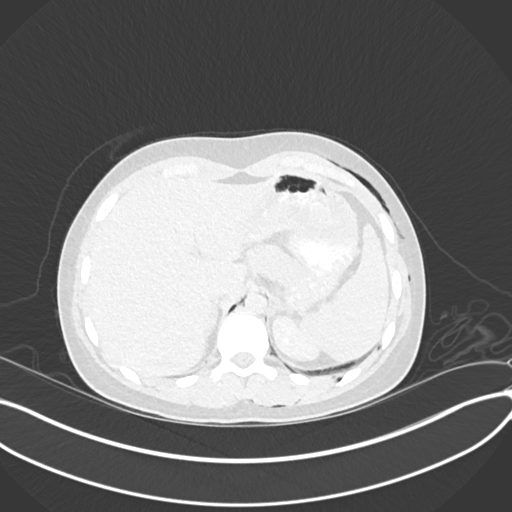
[im 47/152  lung]
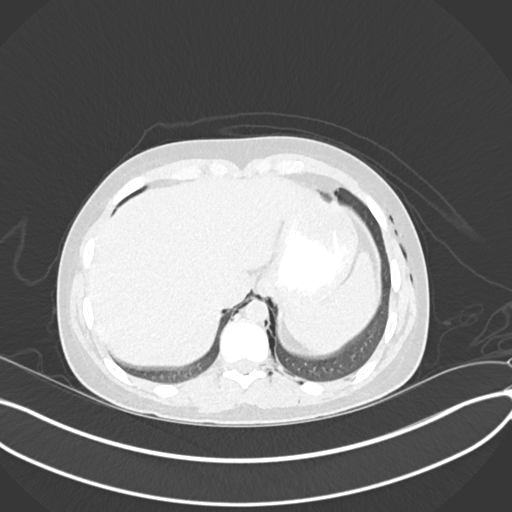
[im 59/152  mediastinal]
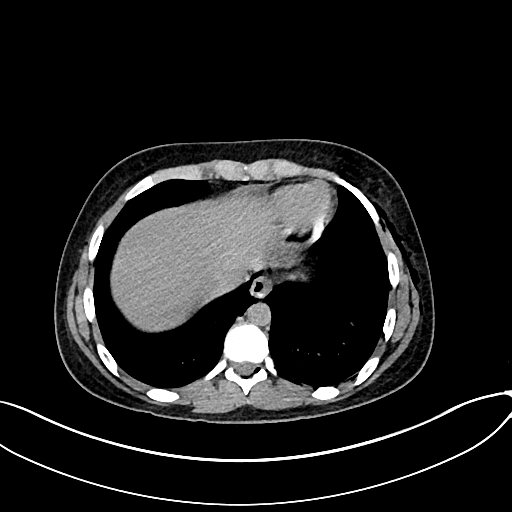
[im 59/152  lung]
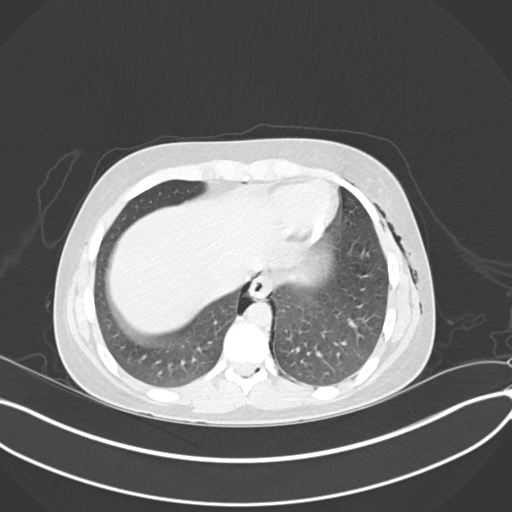
[im 70/152  lung]
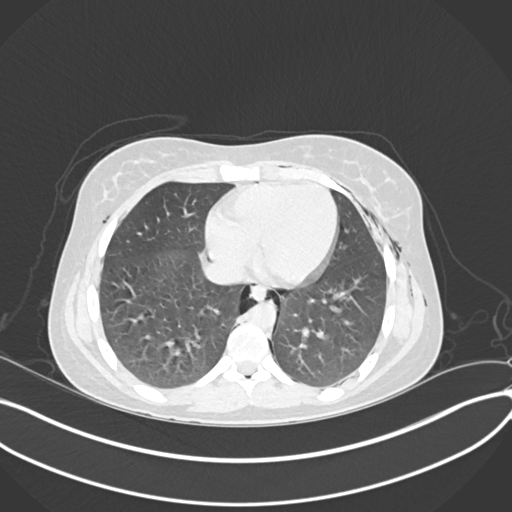
[im 82/152  lung]
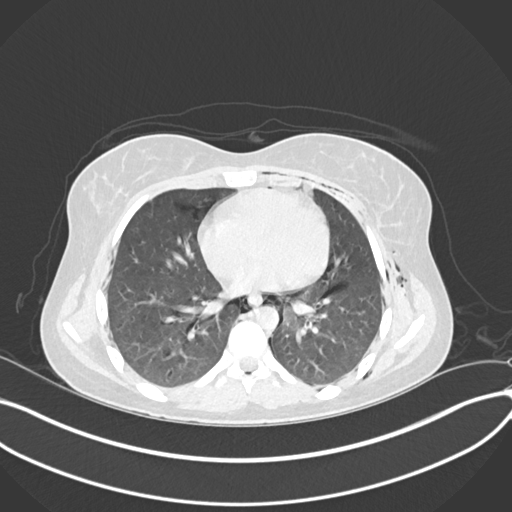
[im 93/152  lung]
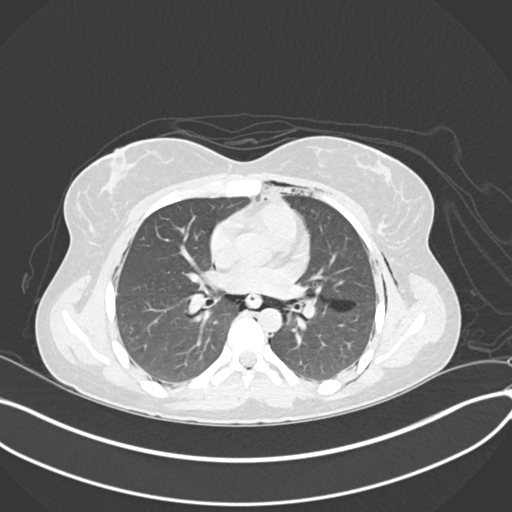
[im 105/152  mediastinal]
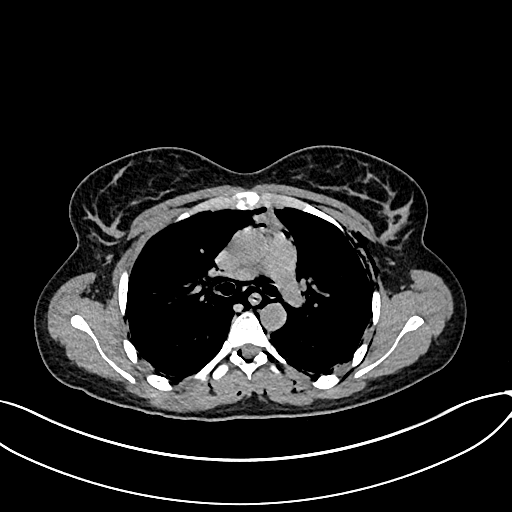
[im 105/152  lung]
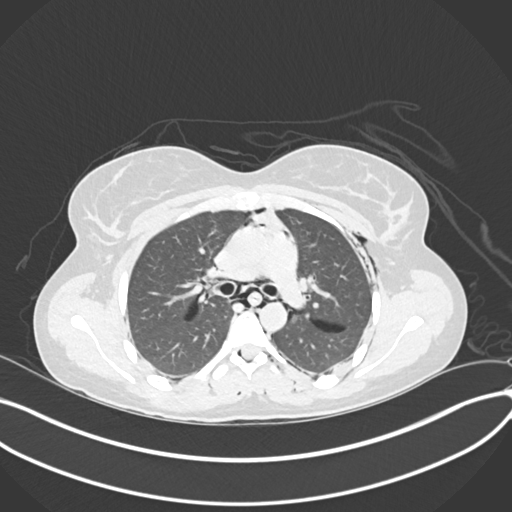
[im 117/152  lung]
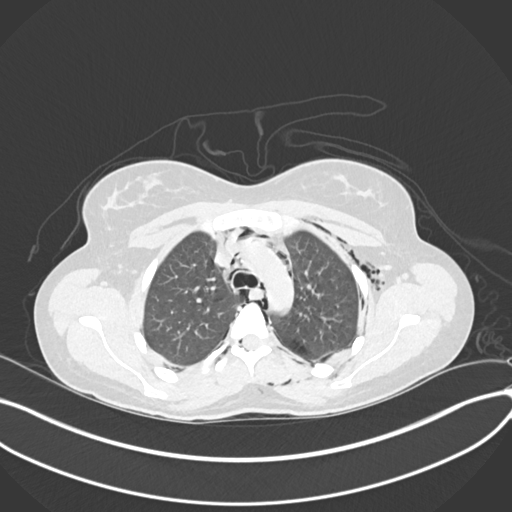
[im 128/152  lung]
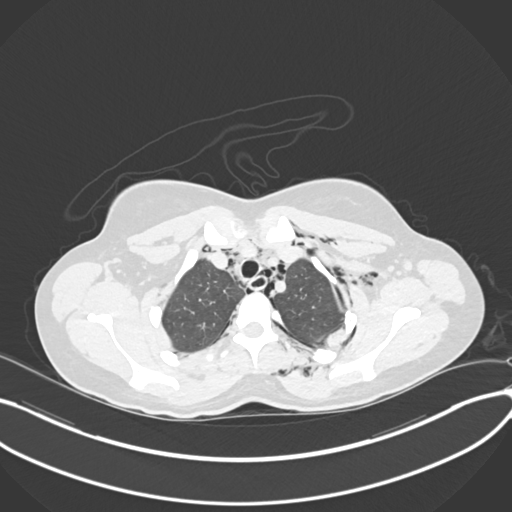
[im 140/152  lung]
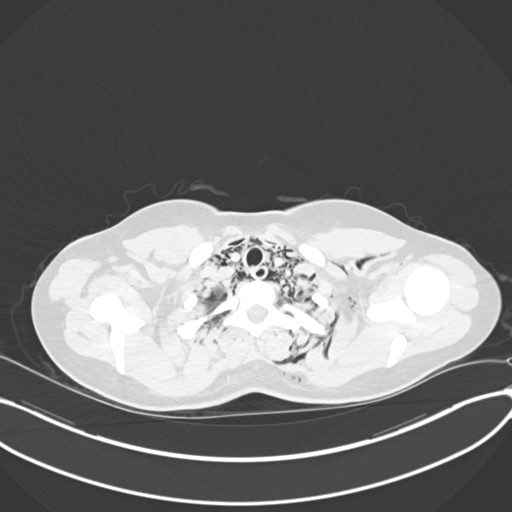

[Series 5: cor · coronal · 0.63mm/px · 3 of 110 slices shown]
[im 22/110  lung]
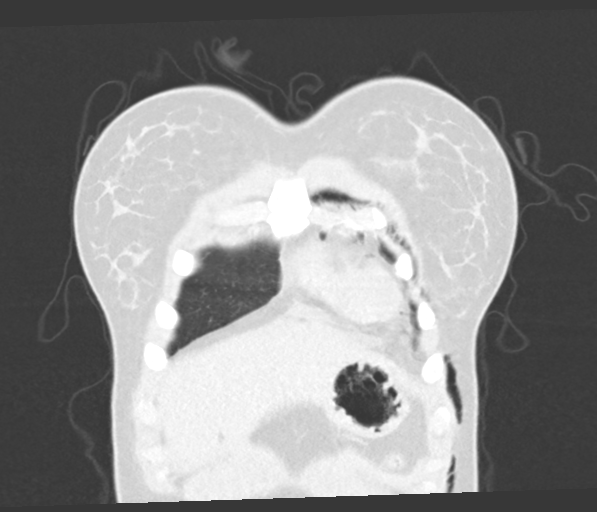
[im 44/110  lung]
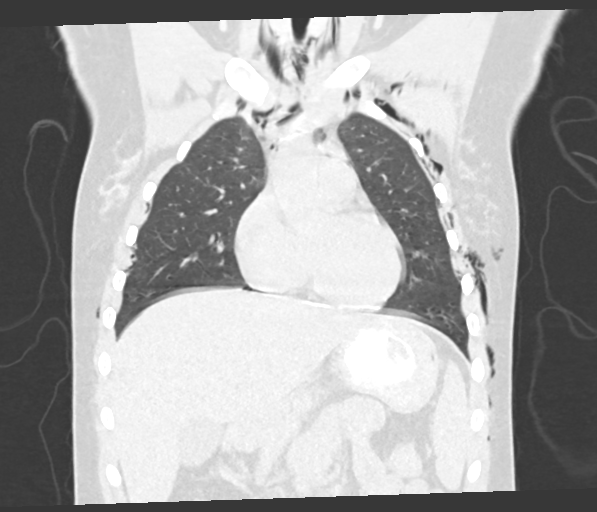
[im 66/110  lung]
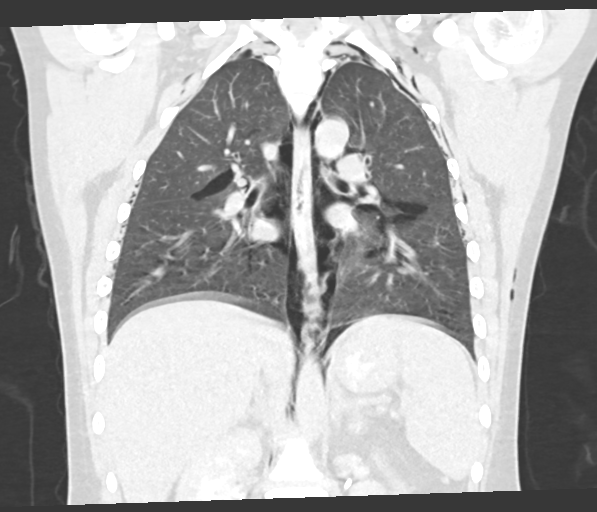

[15 of 36 positions shown; findings below may reference images not displayed]

FINDINGS: Cardiovascular: No cardiomegaly or pericardial effusion. No IV
contrast administered.

Mediastinum/Nodes: Oral contrast was administered prior to this
exam. There is trace residual oral contrast in the esophagus (on
series 2, images 55, 93) and p.o. contrast documented in the
stomach.

There is extensive pneumomediastinum throughout. There is no
contrast extravasation. There is no superimposed mediastinal soft
tissue mass or lymphadenopathy.

Lungs/Pleura: Pneumomediastinum tracking from the hila partially
into the bilateral major fissures on series 2, image 53. But no Dorazio
Yarinka pneumothorax. Normal lung volumes. Major airways are patent and
unremarkable. No consolidation, pleural effusion, or abnormal
pulmonary opacity identified.

Upper Abdomen: PO contrast within the stomach. Excreted IV contrast
in the bilateral kidneys. Otherwise stable since [DATE] hours
visible upper abdominal viscera, noncontrast now.

Musculoskeletal: Subcutaneous emphysema throughout the bilateral
lower neck slightly greater on the left. Left greater than right
axillary and chest wall subcutaneous gas. No superimposed axillary
lymphadenopathy or soft tissue mass.

No rib fracture or No osseous abnormality identified.
IMPRESSION: Extensive Pneumomediastinum, with gas tracking from the mediastinum
into other soft tissue spaces of the bilateral chest and visible
neck.

But normal esophagus following PO contrast administration and the
major airways appear patent and normal. No pleural or pericardial
effusion, and both lungs are clear without pneumothorax.

This is most compatible with sequelae of forced Valsalva/Barotrauma
in the setting of vomiting.

## 2022-06-22 IMAGING — CT CT ABD-PELV W/ CM
2 of 5 series · 15 of 46 positions shown, 17 images · IV contrast (APPLIED)
Comparison: Chest radiograph dated 12/29/2018.

CLINICAL DATA: Abdominal pain. Bloody stools. History of
hemorrhoids.

EXAM:
CT ABDOMEN AND PELVIS WITH CONTRAST
TECHNIQUE: Multidetector CT imaging of the abdomen and pelvis was performed
using the standard protocol following bolus administration of
intravenous contrast.

[Series 3: abdomen 2.0 · axial · 0.72mm/px · z∈[-1068,-622]mm · 12 of 251 slices shown, 14 images]
[im 14/251  soft-tissue]
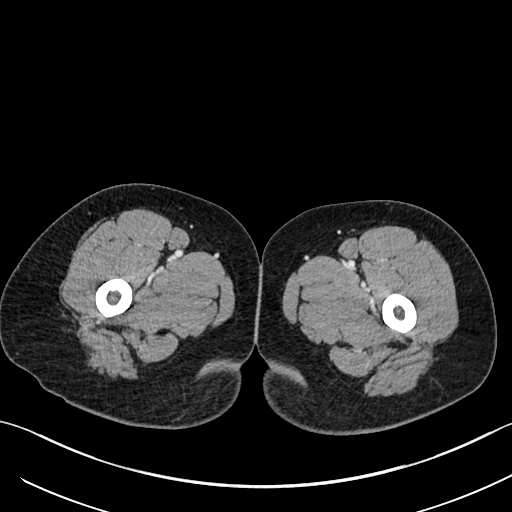
[im 14/251  bone]
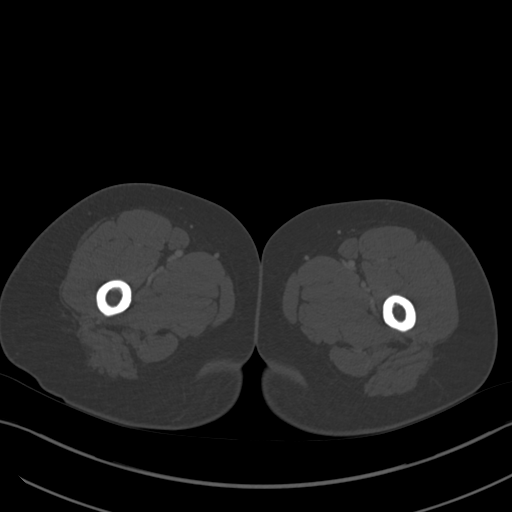
[im 42/251  soft-tissue]
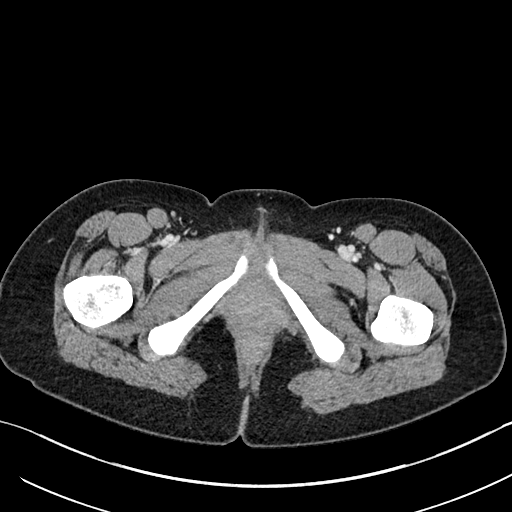
[im 56/251  soft-tissue]
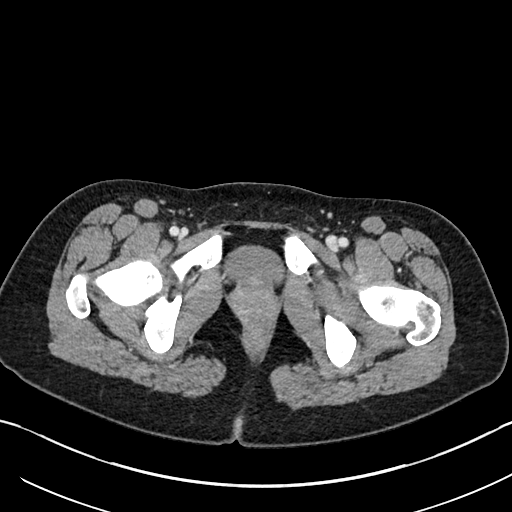
[im 70/251  soft-tissue]
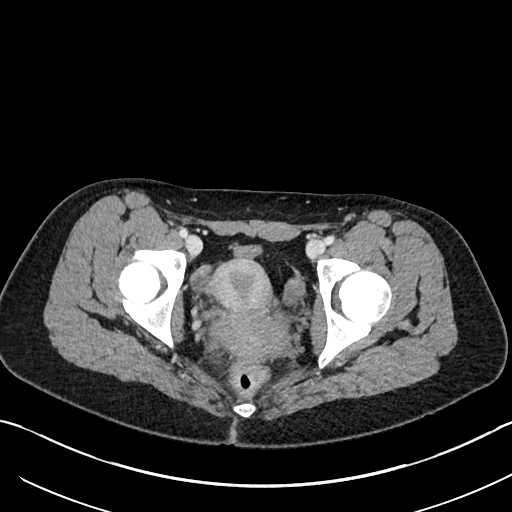
[im 98/251  soft-tissue]
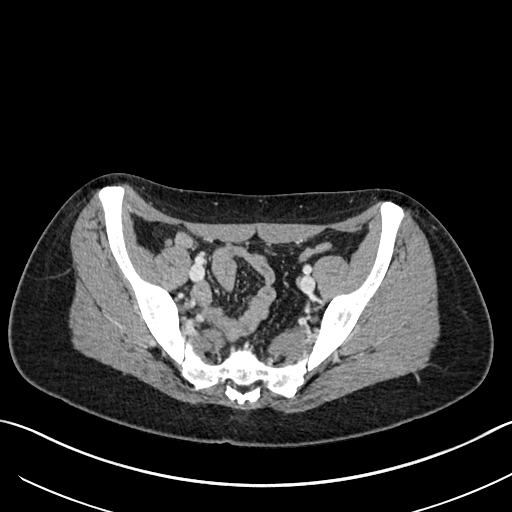
[im 112/251  soft-tissue]
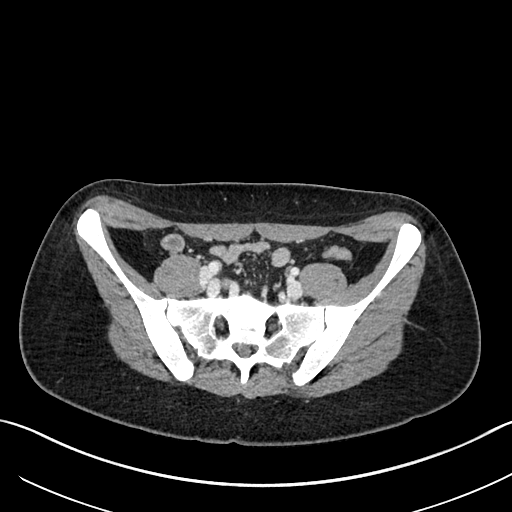
[im 139/251  soft-tissue]
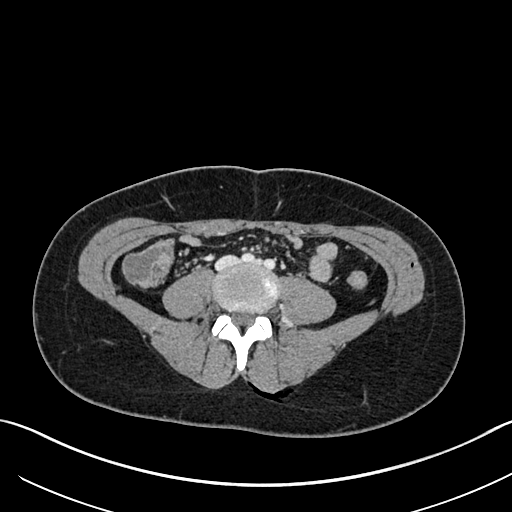
[im 153/251  soft-tissue]
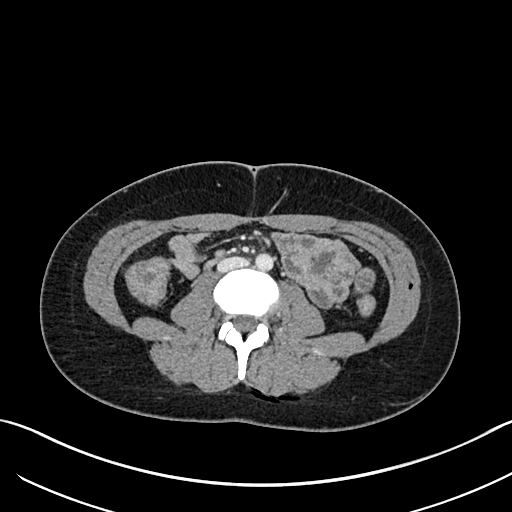
[im 181/251  soft-tissue]
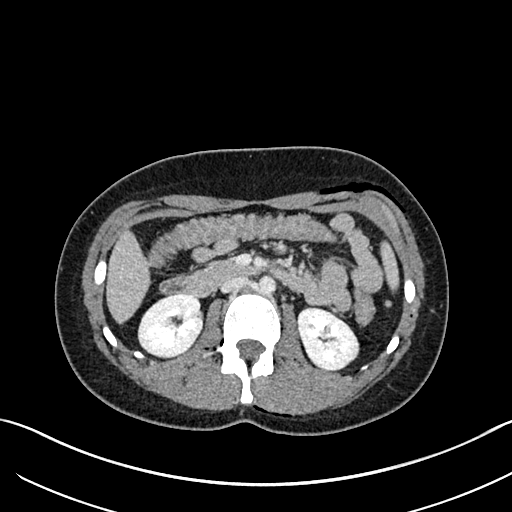
[im 181/251  bone]
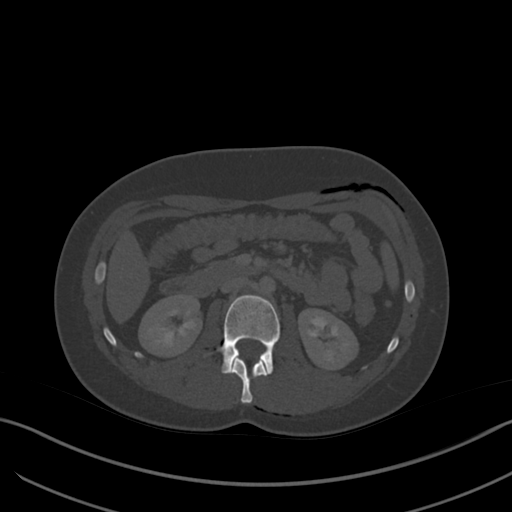
[im 195/251  soft-tissue]
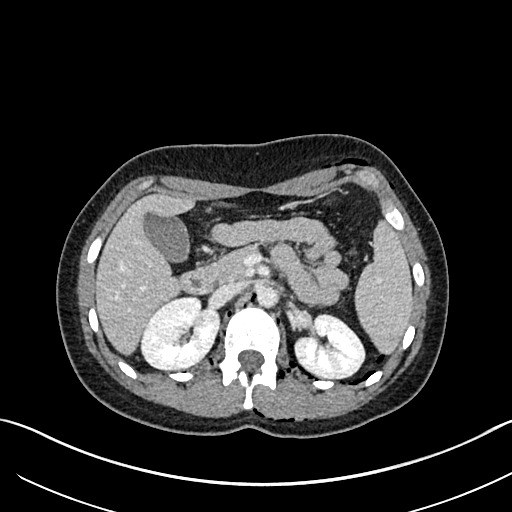
[im 209/251  soft-tissue]
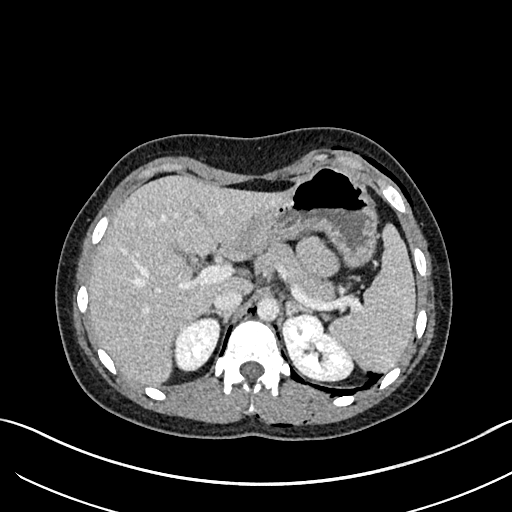
[im 237/251  soft-tissue]
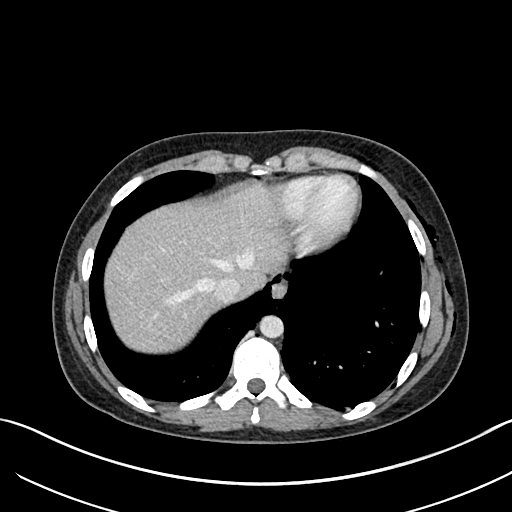

[Series 5: abdomen 3.0 mpr cor · coronal · 0.74mm/px · 3 of 78 slices shown]
[im 26/78  soft-tissue]
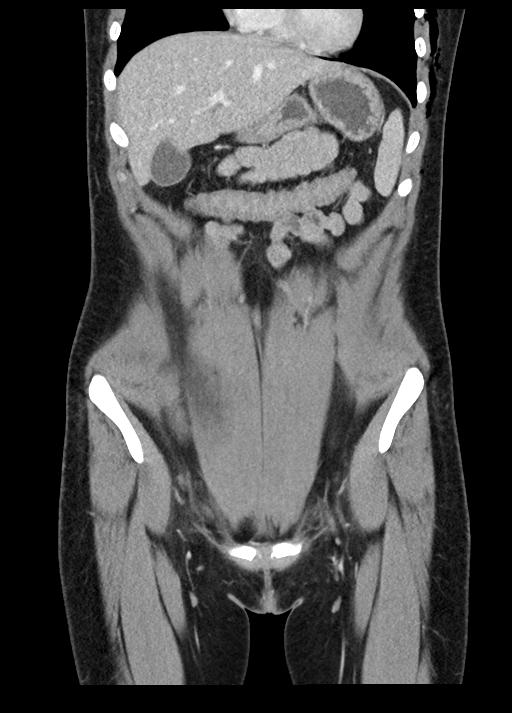
[im 35/78  soft-tissue]
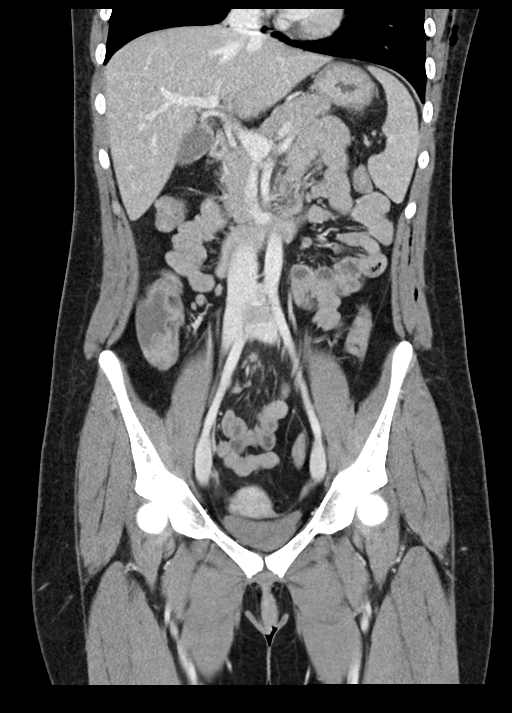
[im 43/78  soft-tissue]
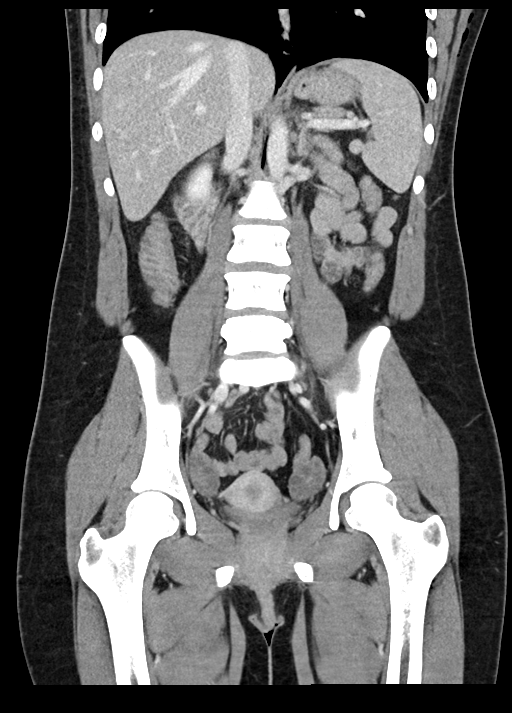

[15 of 46 positions shown; findings below may reference images not displayed]

RADIATION DOSE REDUCTION: This exam was performed according to the
departmental dose-optimization program which includes automated
exposure control, adjustment of the mA and/or kV according to
patient size and/or use of iterative reconstruction technique.

CONTRAST:  100mL OMNIPAQUE IOHEXOL 300 MG/ML  SOLN
FINDINGS: Lower chest: There is pneumomediastinum with air dissecting to the
left posterior pleural space. There may be dissection of the tissue
plane and extension of air into the peritoneal space posterior to
the spleen. No intraperitoneal free fluid. Faint laceration of the
left lower lobe pulmonary parenchyma.

Hepatobiliary: Subcentimeter hypodense focus in the right lobe of
the liver is too small to characterize. The liver is otherwise
unremarkable. No intrahepatic biliary dilatation. The gallbladder is
unremarkable.

Pancreas: Unremarkable. No pancreatic ductal dilatation or
surrounding inflammatory changes.

Spleen: Normal in size without focal abnormality.

Adrenals/Urinary Tract: Adrenal glands are unremarkable. Kidneys are
normal, without renal calculi, focal lesion, or hydronephrosis.
Bladder is unremarkable.

Stomach/Bowel: Loose stool throughout the colon consistent with
diarrheal state. Correlation with clinical exam and stool cultures
recommended. Mild diffuse thickened appearance of the colon likely
related to underdistention. Colitis is less likely. Clinical
correlation is recommended. There is no bowel obstruction. The
appendix is normal.

Vascular/Lymphatic: The abdominal aorta and IVC are unremarkable. No
portal venous gas. There is no adenopathy.

Reproductive: The uterus is anteverted and grossly unremarkable. No
adnexal masses.

Other: None

Musculoskeletal: No acute osseous pathology. There is subcutaneous
air in the left anterior abdomen as well as superficial to the left
rectus spinous muscle. No fluid collection.
IMPRESSION: 1. Partially visualized pneumomediastinum with air dissecting to the
left posterior pleural space and probably into the left upper
peritoneal space. Chest CT is recommended for better evaluation.
2. Loose stool throughout the colon consistent with diarrheal state.
Correlation with clinical exam and stool cultures recommended.
3. Underdistention of the colon versus less likely mild colitis.
Clinical correlation is recommended. No bowel obstruction. Normal
appendix.

These results were called by telephone at the time of interpretation
on 08/10/2021 at [DATE] to provider [HOSPITAL] [facility] , who verbally
acknowledged these results.

## 2023-05-21 ENCOUNTER — Encounter: Payer: Self-pay | Admitting: Nurse Practitioner

## 2023-05-21 ENCOUNTER — Ambulatory Visit: Payer: Medicaid Other | Admitting: Nurse Practitioner

## 2023-05-21 ENCOUNTER — Ambulatory Visit: Payer: Medicaid Other

## 2023-05-21 VITALS — BP 114/73 | HR 80 | Ht 63.0 in | Wt 147.0 lb

## 2023-05-21 DIAGNOSIS — Z309 Encounter for contraceptive management, unspecified: Secondary | ICD-10-CM | POA: Diagnosis not present

## 2023-05-21 DIAGNOSIS — Z01419 Encounter for gynecological examination (general) (routine) without abnormal findings: Secondary | ICD-10-CM | POA: Diagnosis not present

## 2023-05-21 DIAGNOSIS — Z3009 Encounter for other general counseling and advice on contraception: Secondary | ICD-10-CM

## 2023-05-21 DIAGNOSIS — Z113 Encounter for screening for infections with a predominantly sexual mode of transmission: Secondary | ICD-10-CM

## 2023-05-21 NOTE — Progress Notes (Signed)
Pt is here for PE and STD testing.  Wet mount results reviewed, no treatment required per SO.  FP packet given.  Windle Guard, RN.

## 2023-05-22 LAB — WET PREP FOR TRICH, YEAST, CLUE
Trichomonas Exam: NEGATIVE
Yeast Exam: NEGATIVE

## 2023-05-27 NOTE — Progress Notes (Signed)
[facility] [address]- [facility] Main Number: [id number]  Family Planning Visit- Repeat Yearly Visit  Subjective:  Robin [facility] is a 22 y.o. G0P0000  being seen today for an annual wellness visit and to discuss contraception options.   The patient is currently using Hormonal Implant for pregnancy prevention. Patient does not want a pregnancy in the next year.   They report they are looking for a method that provides High efficacy at preventing pregnancy  Patient has the following medical problems: has Clavicle fracture, shaft; Nexplanon in place; Colitis; Pneumomediastinum (HCC); and Acute gastroenteropathy due to Norovirus on their problem list.  Chief Complaint  Patient presents with   Annual Exam   Patient reports 2 female partners over the last 12 months and practice oral and vaginal sex with condom use sometimes. Patient does have a hormonal implant that was inserted 6/21. She inquired about having it removed today, but decided to leave it once she found out it was good for about 6 more months. Patient has no STI history.  Patient indcates LMP 05/18/23 and periods are monthly and regular.  PAP history: 11/24/19: NILM, HPV testing not done. PAP overdue. Patient reports being on period today and wishes to return at a later date for PAP.   See flowsheet for other program required questions.   Body mass index is 26.04 kg/m. - Patient is eligible for diabetes screening based on BMI> 25 and age >35?  not applicable HA1C ordered? not applicable  Patient reports 2 of partners in last year. Desires STI screening?  Yes-vaginal swabs. Declines bloodwork.   Has patient been screened once for HCV in the past?  No  No results found for: "HCVAB"  Does the patient have current of drug use, have a partner with drug use, and/or has been incarcerated since last result? No  If yes-- Screen for HCV through [facility]   Does the patient  meet criteria for HBV testing? No  Criteria:  -Household, sexual or needle sharing contact with HBV -History of drug use -HIV positive -Those with known Hep C   Health Maintenance Due  Topic Date Due   HPV VACCINES (1 - 3-dose series) Never done   Hepatitis C Screening  Never done   DTaP/Tdap/Td (1 - Tdap) Never done   Cervical Cancer Screening (Pap smear)  11/24/2022   INFLUENZA VACCINE  Never done   COVID-19 Vaccine (1 - 2024-25 season) Never done    Review of Systems  All other systems reviewed and are negative.   The following portions of the patient's history were reviewed and updated as appropriate: allergies, current medications, past family history, past medical history, past social history, past surgical history and problem list. Problem list updated.  Objective:   Vitals:   05/21/23 1313  BP: 114/73  Pulse: 80  Weight: 147 lb (66.7 kg)  Height: 5\' 3"  (1.6 m)    Physical Exam Vitals and nursing note reviewed.  Constitutional:      Appearance: Normal appearance.  HENT:     Head: Normocephalic.     Salivary Glands: Right salivary gland is not diffusely enlarged or tender. Left salivary gland is not diffusely enlarged or tender.     Mouth/Throat:     Lips: Pink. No lesions.     Mouth: Mucous membranes are moist.     Tongue: No lesions. Tongue does not deviate from midline.     Pharynx: Oropharynx is clear. Uvula midline.  Tonsils: No tonsillar exudate.  Eyes:     General:        Right eye: No discharge.        Left eye: No discharge.     Conjunctiva/sclera:     Right eye: Right conjunctiva is not injected.     Left eye: Left conjunctiva is not injected.  Neck:     Thyroid: No thyroid mass, thyromegaly or thyroid tenderness.     Trachea: Trachea and phonation normal. No tracheal tenderness or tracheal deviation.  Cardiovascular:     Rate and Rhythm: Normal rate and regular rhythm.     Heart sounds: Normal heart sounds, S1 normal and S2 normal.   Pulmonary:     Effort: Pulmonary effort is normal.     Breath sounds: Normal breath sounds and air entry.  Abdominal:     General: Abdomen is flat. Bowel sounds are normal. There is no distension.     Palpations: Abdomen is soft.     Tenderness: There is no abdominal tenderness. There is no guarding or rebound.  Genitourinary:    Comments: Declined genital exam- no symptoms, self swabbed Lymphadenopathy:     Head:     Right side of head: No submental, submandibular, tonsillar, preauricular or posterior auricular adenopathy.     Left side of head: No submental, submandibular, tonsillar, preauricular or posterior auricular adenopathy.     Cervical: No cervical adenopathy.     Right cervical: No superficial or posterior cervical adenopathy.    Left cervical: No superficial or posterior cervical adenopathy.     Upper Body:     Right upper body: No supraclavicular or axillary adenopathy.     Left upper body: No supraclavicular or axillary adenopathy.  Skin:    General: Skin is warm and dry.     Findings: No rash.     Comments: Skin tone appropriate for ethnicity.   Neurological:     Mental Status: She is alert and oriented to person, place, and time.  Psychiatric:        Attention and Perception: Attention normal.        Mood and Affect: Mood and affect normal.        Speech: Speech normal.        Behavior: Behavior normal. Behavior is cooperative.        Thought Content: Thought content normal.    Assessment and Plan:  Robin [facility] is a 22 y.o. female G0P0000 presenting to the [facility] for an yearly wellness and contraception visit  1. Family planning Nexplanon not removed, not due to come out until 11/2023.  Contraception counseling: Reviewed options based on patient desire and reproductive life plan. Patient is interested in Hormonal Implant. This was provided to the patient today. (Nexplanon currently in place and not removed.)  Risks,  benefits, and typical effectiveness rates were reviewed.  Questions were answered.  Written information was also given to the patient to review.    The patient will follow up in  6 months for surveillance.  The patient was told to call with any further questions, or with any concerns about this method of contraception.  Emphasized use of condoms 100% of the time for STI prevention.  Educated on ECP and assessed need for ECP. Patient was NOT offered ECP based on no unprotected sex.   2. Screening for venereal disease (Primary)  - Chlamydia/Gonorrhea [facility] - WET PREP FOR TRICH, YEAST, CLUE  3. Well woman exam  CBE not performed due to guidelines.   11/24/19: NILM, HPV testing not done. PAP overdue. Patient reports being on period today and wishes to return at a later date for PAP.   Return for PAP-was due 11/24/22.  No future appointments.  Edmonia James, NP

## 2023-05-30 ENCOUNTER — Telehealth: Payer: Self-pay

## 2023-05-30 NOTE — Telephone Encounter (Signed)
PW verified.  Pt notified of positive Chlamydia results.  Treatment appointment made for 12/20 @ 3:30 pm.  Berdie Ogren, RN

## 2023-06-01 ENCOUNTER — Ambulatory Visit: Payer: Medicaid Other

## 2023-06-01 DIAGNOSIS — A749 Chlamydial infection, unspecified: Secondary | ICD-10-CM

## 2023-06-01 MED ORDER — DOXYCYCLINE HYCLATE 100 MG PO TABS: 100.0000 mg | ORAL_TABLET | Freq: Two times a day (BID) | ORAL | Status: AC

## 2023-06-01 NOTE — Progress Notes (Signed)
Pt is here for Chlamydia treatment LMP: 05/18/2023 BCM:  Nexplanon The patient was dispensed Doxycycline 100 mg #14 today. I provided counseling today regarding the medication. We discussed the medication, the side effects and when to call clinic. Patient given the opportunity to ask questions. Questions answered. Condoms given.  Berdie Ogren, RN

## 2024-03-21 ENCOUNTER — Ambulatory Visit

## 2024-03-21 VITALS — BP 111/77 | HR 103 | Ht 63.0 in | Wt 160.4 lb

## 2024-03-21 DIAGNOSIS — Z30017 Encounter for initial prescription of implantable subdermal contraceptive: Secondary | ICD-10-CM | POA: Diagnosis not present

## 2024-03-21 DIAGNOSIS — Z3009 Encounter for other general counseling and advice on contraception: Secondary | ICD-10-CM

## 2024-03-21 DIAGNOSIS — Z3046 Encounter for surveillance of implantable subdermal contraceptive: Secondary | ICD-10-CM

## 2024-03-21 DIAGNOSIS — Z113 Encounter for screening for infections with a predominantly sexual mode of transmission: Secondary | ICD-10-CM

## 2024-03-21 DIAGNOSIS — Z309 Encounter for contraceptive management, unspecified: Secondary | ICD-10-CM | POA: Diagnosis not present

## 2024-03-21 DIAGNOSIS — Z124 Encounter for screening for malignant neoplasm of cervix: Secondary | ICD-10-CM

## 2024-03-21 LAB — WET PREP FOR TRICH, YEAST, CLUE
Clue Cell Exam: NEGATIVE
Trichomonas Exam: NEGATIVE
Yeast Exam: NEGATIVE

## 2024-03-21 MED ORDER — ETONOGESTREL 68 MG ~~LOC~~ IMPL: 68.0000 mg | DRUG_IMPLANT | Freq: Once | SUBCUTANEOUS | Status: AC

## 2024-03-21 NOTE — Progress Notes (Signed)
 Pt is here for PE,Nexplanon  removal and reinsertion. Wet prep results review with patient and requires no treatment. Nexplanon  removed successfully from the Lt Arm and a new Nexplanon  inserted into the lt arm by Damien Satchel, [facility], and pt tolerated well to the process with no complications. Opportunity given to pt to ask questions for any clarifications. Questions Answered. Condoms declined. Wilkie Drought, RN.

## 2024-03-21 NOTE — Progress Notes (Signed)
 [facility] [address] [place] [address] Main phone: [phone]  Family Planning Visit - Repeat Yearly Visit  Subjective:  Robin [facility] is a 23 y.o. G0P0000  being seen today to discuss contraception options. The patient is currently using hormonal implant for pregnancy prevention. Patient does not want a pregnancy in the next year.   Patient reports they would a Nexplanon  removal and reinsertion.  Patient has the following medical problems:  Patient Active Problem List   Diagnosis Date Noted   Acute gastroenteropathy due to Norovirus 08/11/2021   Colitis 08/10/2021   Pneumomediastinum (HCC) 08/10/2021   Nexplanon  in place 11/24/2019   Clavicle fracture, shaft 06/17/2013   Chief Complaint  Patient presents with   Annual Exam    P/Nexplanon  removal and reinsertion   HPI Patient reports desire to remove/replace her Nexplanon . Placed June 2021. Had physical in Dec 2024. Also due for pap - was NILM in 2021. Desires STI testing as well. No other concerns today.  Review of Systems  Neurological:  Positive for dizziness.  All other systems reviewed and are negative.  See flowsheet for further details and programmatic requirements Hyperlink available at the top of the signed note in blue.  Flow sheet content below:  Pregnancy Intention Screening Does the patient want to become pregnant in the next year?: No Does the patient's partner want to become pregnant in the next year?: No Would the patient like to discuss contraceptive options today?: Yes Other:  Password: [email or web address] Sexual History What age did you start your period?: 10 How often do you have your period?: monthly Date of last sex?:  (2 months ago) Has the patient had unprotected sex within the last 5 days?: No Do you have sex with men, women, both men and women?: Men only In the past 2 months how many partners have you had sex with?: 1 In  the past 12 months, how many partners have you had sex with?: 2 Is it possible that any of your sex partners in the past 12 months had sex with someone else whild they were still in a sexual relationship with you?: Yes What ways do you have sex?: Vaginal Do you or your partner use condoms and/or dental dams every time you have vaginal, oral or anal sex?: Sometimes Do you douche?: No Date of last HIV test?: 08/12/22 Have you ever had an STD?: Yes Have any of your partners had an STD?: Yes Partner Previous STD?: Chlamydia Date?:  (a year ago) Have you or your partner ever shot up drugs?: No Have any of your partners used drugs in the past?: No Have you or your partners exchanged money or drugs for sex?: No  Diabetes screening This patient is 23 y.o. with a BMI of Body mass index is 28.41 kg/m.SABRA  Is patient eligible for diabetes screening (age >35 and BMI >25)?  no  Was Hgb A1c ordered? no  STI screening Patient reports 2 of partners in last year.  Does this patient desire STI screening?  Yes  Cervical Cancer Screening  Result Date Procedure Results Follow-ups  11/24/2019 Pap IG (Image Guided) DIAGNOSIS:: Comment Specimen adequacy:: Comment Clinician Provided ICD10: Comment Performed by:: Comment PAP Smear Comment: . Note:: Comment Test Methodology: Comment    Health Maintenance Due  Topic Date Due   HPV VACCINES (1 - 3-dose series) Never done   Hepatitis C Screening  Never done   DTaP/Tdap/Td (1 - Tdap) Never done  Hepatitis B Vaccines 19-59 Average Risk (1 of 3 - 19+ 3-dose series) Never done   Cervical Cancer Screening (Pap smear)  11/24/2022   Influenza Vaccine  Never done   COVID-19 Vaccine (1 - 2025-26 season) Never done   The following portions of the patient's history were reviewed and updated as appropriate: allergies, current medications, past family history, past medical history, past social history, past surgical history and problem list. Problem list  updated.  Objective:   Vitals:   03/21/24 1035  BP: 111/77  Pulse: (!) 103  Weight: 160 lb 6.4 oz (72.8 kg)  Height: 5' 3 (1.6 m)   Physical Exam Vitals and nursing note reviewed. Exam conducted with a chaperone present Brett Orange).  Constitutional:      Appearance: Normal appearance.  HENT:     Head: Normocephalic and atraumatic.     Mouth/Throat:     Mouth: Mucous membranes are moist.     Pharynx: Oropharynx is clear. No oropharyngeal exudate or posterior oropharyngeal erythema.  Pulmonary:     Effort: Pulmonary effort is normal.  Abdominal:     General: Abdomen is flat.     Palpations: There is no mass.     Tenderness: There is no abdominal tenderness. There is no rebound.  Genitourinary:    General: Normal vulva.     Exam position: Lithotomy position.     Pubic Area: No rash or pubic lice.      Labia:        Right: No rash or lesion.        Left: No rash or lesion.      Vagina: Normal. No vaginal discharge, erythema, bleeding or lesions.     Cervix: No cervical motion tenderness, discharge, friability, lesion or erythema.     Uterus: Normal.      Adnexa: Right adnexa normal and left adnexa normal.     Rectum: Normal.  Lymphadenopathy:     Head:     Right side of head: No preauricular or posterior auricular adenopathy.     Left side of head: No preauricular or posterior auricular adenopathy.     Cervical: No cervical adenopathy.     Upper Body:     Right upper body: No supraclavicular, axillary or epitrochlear adenopathy.     Left upper body: No supraclavicular, axillary or epitrochlear adenopathy.  Skin:    General: Skin is warm and dry.     Findings: No rash.  Neurological:     Mental Status: She is alert and oriented to person, place, and time.     Assessment and Plan:  [facility] is a 23 y.o. female G0P0000 presenting to the [facility] Department for an yearly wellness and contraception visit  1. Family planning  (Primary)  Contraception counseling:  Reviewed options based on patient desire and reproductive life plan. Patient is interested in Hormonal Implant. This was provided to the patient today.   Risks, benefits, and typical effectiveness rates were reviewed.  Questions were answered.  Written information was also given to the patient to review.    The patient will follow up in  1 years for surveillance.  The patient was told to call with any further questions, or with any concerns about this method of contraception.  Emphasized use of condoms 100% of the time for STI prevention.  Emergency Contraception Precautions (ECP): Patient assessed for need of ECP. She is not a candidate based on LARC in place and unexpired .  2. Screening for cervical cancer  - IGP, rfx Aptima HPV ASCU  3. Screening for venereal disease  - WET PREP FOR TRICH, YEAST, CLUE - Chlamydia/Gonorrhea [facility] - HIV [facility] [place] [facility] - Syphilis Serology, [facility]  4. Encounter for removal and reinsertion of Nexplanon   - etonogestrel  (NEXPLANON ) implant 68 mg   Procedure:  Nexplanon  Removal and Insertion  Patient identified, informed consent performed, consent signed.   Patient does understand that irregular bleeding is a very common side effect of this medication. She was advised to have backup contraception for one week after replacement of the implant. Patient deemed to meet WHO criteria for being reasonably certain she is not pregnant.  Appropriate time out taken. Nexplanon  site identified in the patient's left arm. Area prepped in usual sterile fashon. 1 ml of 1% lidocaine with epinephrine was used to anesthetize the area at the distal end of the implant. A small stab incision was made right beside the implant on the distal portion. The Nexplanon  rod was grasped using hemostats and removed without difficulty. There was minimal blood loss. There were no complications.   Confirmed correct location of insertion site.  The insertion site was identified 8-10 cm (3-4 inches) from the medial epicondyle of the humerus and 3-5 cm (1.25-2 inches) posterior to (below) the sulcus (groove) between the biceps and triceps muscles of the patient's left arm. New Nexplanon  removed from packaging, device confirmed in needle, then inserted full length of needle and withdrawn per handbook instructions. Nexplanon  was able to palpated in the patient's arm; patient palpated the insert herself.  There was minimal blood loss. Patient insertion site covered with guaze and a pressure bandage to reduce any bruising. The patient tolerated the procedure well and was given post procedure instructions.     Return in about 1 year (around 03/21/2025).  No future appointments.  Damien FORBES Satchel, NP

## 2024-03-24 ENCOUNTER — Ambulatory Visit: Payer: Self-pay | Admitting: Family Medicine

## 2024-03-24 LAB — IGP, RFX APTIMA HPV ASCU: PAP Smear Comment: 0

## 2024-03-24 NOTE — Progress Notes (Signed)
 PAP smear reviewed, result: NILM (normal), HPV co-testing not indicated. Based on this result and patient's prior cervical cancer screenings, [facility] currently recommends repeat PAP smear in 3 years.  Dorothyann Helling, MD 03/24/24  6:39 PM

## 2024-07-09 NOTE — Addendum Note (Signed)
 Addended by: ROSABEL PERKINS E on: 07/09/2024 01:20 PM   Modules accepted: Orders

## 2024-07-16 ENCOUNTER — Telehealth: Payer: Self-pay

## 2024-07-16 NOTE — Telephone Encounter (Signed)
 Pt was called concerning blood samples but report she refused blood works on the day of appointment.
